# Patient Record
Sex: Female | Born: 1995 | Race: Black or African American | Hispanic: No | Marital: Married | Smoking: Never smoker
Health system: Southern US, Community
[De-identification: ages and names within clinical notes are randomized; demographics above are authoritative.]

## PROBLEM LIST (undated history)

## (undated) DIAGNOSIS — S060X9A Concussion with loss of consciousness of unspecified duration, initial encounter: Secondary | ICD-10-CM

## (undated) DIAGNOSIS — H00019 Hordeolum externum unspecified eye, unspecified eyelid: Secondary | ICD-10-CM

## (undated) DIAGNOSIS — N63 Unspecified lump in unspecified breast: Secondary | ICD-10-CM

## (undated) DIAGNOSIS — S060XAA Concussion with loss of consciousness status unknown, initial encounter: Secondary | ICD-10-CM

## (undated) DIAGNOSIS — T7840XA Allergy, unspecified, initial encounter: Secondary | ICD-10-CM

## (undated) HISTORY — DX: Allergy, unspecified, initial encounter: T78.40XA

## (undated) HISTORY — DX: Concussion with loss of consciousness of unspecified duration, initial encounter: S06.0X9A

## (undated) HISTORY — PX: NO PAST SURGERIES: SHX2092

## (undated) HISTORY — DX: Hordeolum externum unspecified eye, unspecified eyelid: H00.019

## (undated) HISTORY — DX: Concussion with loss of consciousness status unknown, initial encounter: S06.0XAA

---

## 2013-11-13 DIAGNOSIS — M24859 Other specific joint derangements of unspecified hip, not elsewhere classified: Secondary | ICD-10-CM | POA: Insufficient documentation

## 2013-11-20 ENCOUNTER — Encounter (HOSPITAL_COMMUNITY): Payer: Self-pay | Admitting: Emergency Medicine

## 2013-11-20 ENCOUNTER — Emergency Department (INDEPENDENT_AMBULATORY_CARE_PROVIDER_SITE_OTHER): Payer: BC Managed Care – PPO

## 2013-11-20 ENCOUNTER — Emergency Department (HOSPITAL_COMMUNITY)
Admission: EM | Admit: 2013-11-20 | Discharge: 2013-11-20 | Disposition: A | Discharge status: Home / Self Care | Payer: BC Managed Care – PPO | Source: Home / Self Care | Attending: Family Medicine | Admitting: Family Medicine

## 2013-11-20 DIAGNOSIS — R29898 Other symptoms and signs involving the musculoskeletal system: Secondary | ICD-10-CM

## 2013-11-20 DIAGNOSIS — M24859 Other specific joint derangements of unspecified hip, not elsewhere classified: Secondary | ICD-10-CM

## 2013-11-20 MED ORDER — DICLOFENAC SODIUM 50 MG PO TBEC
50.0000 mg | DELAYED_RELEASE_TABLET | Freq: Two times a day (BID) | ORAL | Status: DC | PRN
Start: 2013-11-20 — End: 2017-04-22

## 2013-11-20 NOTE — Discharge Instructions (Signed)
Thank you for coming in today. Go to physical therapy If not getting better followup with Dr. Katrinka BlazingSmith or  sports medicine Do the exercises as above Take diclofenac twice daily for pain as needed  Snapping Hip Syndrome with Rehab Snapping hip syndrome is a condition characterized by snapping that you may feel or hear. There are multiple causes of snapping hip syndrome, some involve soft tissue (tendon or ligament) passing over and being caught on a bony prominence. It may also be caused by loose pieces of bone or cartilage that are present in the hip joint that become caught on soft tissues. SYMPTOMS   "Snapping" sensation in the hip that is either felt and/or heard.  Pain, tenderness, and/or inflammation of the outer hip (iliotibial (IT) band). CAUSES  Snapping hip syndrome is caused by a mechanical disruption in the hip. Common mechanisms of injury include:  Tendons snapping off bony prominences. For example the iliotibial (IT) band snapping over the greater trochanter of the hip.  Loose fragments (bone or cartilage) being caught in the hip joint. RISK INCREASES WITH:  Contact sports (football, hockey, or soccer).  Improperly fitted or padded protective equipment.  Endurance sports (distance running, triathlon, race walking).  Activities that require bending, lifting, or climbing.  Poor strength and flexibility.  Failure to warm-up properly before activity.  Flat feet.  Improper knee alignment (knock knees or bowlegged).  Compensation for other extremity injuries. PREVENTION  Warm up and stretch properly before activity.  Allow for adequate recovery between workouts.  Maintain physical fitness:  Strength, flexibility, and endurance.  Cardiovascular fitness.  Learn and use proper technique. When possible, have a coach correct improper technique.  Wear properly fitted and padded protective equipment.  Wear arch supports (orthotics) if you have flat  feet. PROGNOSIS  If treated properly, then snapping hip syndrome usually resolves within 2 to 6 weeks.  RELATED COMPLICATIONS   Prolonged healing time, if improperly treated or re-injured.  Recurrent symptoms that result in a chronic problem. TREATMENT Treatment initially involves the use of ice and medication to help reduce pain and inflammation. The use of strengthening and stretching exercises may help reduce pain with activity. These exercises may be performed at home or with referral to a therapist. If you have flat feet, then your caregiver may recommend that you wear arch supports. Your caregiver may recommend corticosteroid injections to help reduce inflammation that may be causing the mechanical problems in the hip. If symptoms persist for greater than 6 months despite non-surgical (conservative) treatment, then surgery may be recommended. MEDICATION   If pain medication is necessary, then nonsteroidal anti-inflammatory medications, such as aspirin and ibuprofen, or other minor pain relievers, such as acetaminophen, are often recommended.  Do not take pain medication for 7 days before surgery.  Prescription pain relievers may be given if deemed necessary by your caregiver. Use only as directed and only as much as you need.  Corticosteroid injections may be given by your caregiver. These injections should be reserved for the most serious cases, because they may only be given a certain number of times. HEAT AND COLD  Cold treatment (icing) relieves pain and reduces inflammation. Cold treatment should be applied for 10 to 15 minutes every 2 to 3 hours for inflammation and pain and immediately after any activity that aggravates your symptoms. Use ice packs or massage the area with a piece of ice (ice massage).  Heat treatment may be used prior to performing the stretching and strengthening activities prescribed by  your caregiver, physical therapist, or athletic trainer. Use a heat pack or  soak your injury in warm water. SEEK MEDICAL CARE IF:  Treatment seems to offer no benefit, or the condition worsens.  Any medications produce adverse side effects. EXERCISES RANGE OF MOTION (ROM) AND STRETCHING EXERCISES - Snapping Hip Syndrome These exercises may help you when beginning to rehabilitate your injury. Your symptoms may resolve with or without further involvement from your physician, physical therapist or athletic trainer. While completing these exercises, remember:   Restoring tissue flexibility helps normal motion to return to the joints. This allows healthier, less painful movement and activity.  An effective stretch should be held for at least 30 seconds.  A stretch should never be painful. You should only feel a gentle lengthening or release in the stretched tissue. STRETCH - Hip Rotators  Lie on your back on a firm surface. Grasp your right / left knee with your right / left hand and your ankle with your opposite hand.  Keeping your hips and shoulders firmly planted, gently pull your right / left knee and rotate your lower leg toward your opposite shoulder until you feel a stretch in your buttocks.  Hold this stretch for __________ seconds. Repeat this stretch __________ times. Complete this stretch __________ times per day. STRETCH  Iliotibial Band   On the floor or bed, lie on your side so your right / left leg is on top. Bend your knee and grab your ankle.  Slowly bring your knee back so that your thigh is in line with your trunk. Keep your heel at your buttocks and gently arch your back so your head, shoulders and hips line up.  Slowly lower your leg so that your knee approaches the floor/bed until you feel a gentle stretch on the outside of your right / left thigh. If you do not feel a stretch and your knee will not fall farther, place the heel of your opposite foot on top of your knee and pull your thigh down farther.  Hold this stretch for __________  seconds. Repeat __________ times. Complete this exercise __________ times per day. STRETCH - Hip Adductors, Sitting  Sit on the floor and place the bottoms of your feet together. Keep your chest up and look straight ahead to keep your back in proper alignment. Slide your feet in towards your body as far as you can without rounding your back or increasing any discomfort.  Gently push down on your knees until you feel a gentle stretch in your inner thighs. Hold this position for __________ seconds. Repeat __________ times. Complete this exercise __________ times per day.  STRETCHING - Hip Flexors, Lunge  Half kneel with your right / left knee on the floor and your opposite knee bent and directly over your ankle.  Keep good posture with your head over your shoulders. Tighten your buttocks to point your tailbone downward; this will prevent your back from arching too much.  You should feel a gentle stretch in the front of your right / left thigh and/or hip. If you do not feel any resistance, slightly slide your opposite foot forward and then slowly lunge forward so your knee once again lines up over your ankle. Be sure your tailbone remains pointed downward.  Hold this stretch for __________ seconds. Repeat __________ times. Complete this stretch __________ times per day. STRENGTHENING EXERCISES - Snapping Hip Syndrome These exercises may help you when beginning to rehabilitate your injury. They may resolve your symptoms with or  without further involvement from your physician, physical therapist or athletic trainer. While completing these exercises, remember:   Muscles can gain both the endurance and the strength needed for everyday activities through controlled exercises.  Complete these exercises as instructed by your physician, physical therapist or athletic trainer. Progress with the resistance and repetition exercises only as your caregiver advises.  You may experience muscle soreness or  fatigue, but the pain or discomfort you are trying to eliminate should never worsen during these exercises. If this pain does worsen, stop and make certain you are following the directions exactly. If the pain is still present after adjustments, discontinue the exercise until you can discuss the trouble with your clinician. STRENGTH - Hip Abductors, Straight Leg Raises Be aware of your form throughout the entire exercise so that you exercise the correct muscles. Sloppy form means that you are not strengthening the correct muscles.  Lie on your side so that your head, shoulders, knee and hip line up. You may bend your lower knee to help maintain your balance. Your right / left leg should be on top.  Roll your hips slightly forward, so that your hips are stacked directly over each other and your right / left knee is facing forward.  Lift your top leg up 4-6 inches, leading with your heel. Be sure that your foot does not drift forward or that your knee does not roll toward the ceiling.  Hold this position for __________ seconds. You should feel the muscles in your outer hip lifting (you may not notice this until your leg begins to tire).  Slowly lower your leg to the starting position. Allow the muscles to fully relax before beginning the next repetition. Repeat __________ times. Complete this exercise __________ times per day.  STRENGTH - Hip Abductors, Quadriped  On a firm, lightly padded surface, position yourself on your hands and knees. Your hands should be directly below your shoulders and your knees should be directly below your hips.  Keeping your right / left knee bent, lift your leg out to the side. Keep your legs level and in line with your shoulders.  Position yourself on your hands and knees.  Hold for __________ seconds.  Keeping your trunk steady and your hips level, slowly lower your leg to the starting position. Repeat __________ times. Complete this exercise __________ times  per day.  STRENGTH - Hip Abductors, Standing Be aware of your form throughout the entire exercise so that you exercise the correct muscles. Sloppy form means that you are not strengthening the correct muscles.  Tie one end of a rubber exercise band/tubing to a secure surface (table, pole) and tie a loop at the other end.  Place the loop around your right / left ankle. Turn your body sideways so that your opposite side faces the table or pole and your right / left leg away from the table or pole. Step away from the pole or table until there is tension in the tube/band.  Hold onto a chair as needed for balance.  Keeping your back upright, your shoulders over your hips, and your toes pointing forward, lift your right / left leg out to your side. Be sure to lift your leg with your hip muscles. Do not "throw" your leg or tip your body to lift your leg.  Slowly and with control, return to the starting position. Repeat exercise __________ times. Complete this exercise __________ times per day.  Document Released: 08/19/2005 Document Revised: 11/11/2011 Document Reviewed: 12/01/2008  [employer]® Patient Information ©2014 [employer]. ° °

## 2013-11-20 NOTE — ED Notes (Signed)
Pt  Reports  l  Hip  Pain  X  3  Weeks  Pain on  Certain  Movements  And  posistions          Pt     denys  Any  specefic injury          Ambulated  To room   With a  Steady  Fluid  Gait

## 2013-11-20 NOTE — ED Provider Notes (Signed)
Selena Barton is a 18 y.o. female who presents to Urgent Care today for left hip pain. Patient has left groin pain radiating to the anterior knee with hip motion and walking. This is been present for 3 weeks. This is associated with a snapping sensation. She denies any pain radiating beyond the knee weakness or numbness. No swelling bladder dysfunction. No difficulty walking. She's tried some over-the-counter medications which have not been very effective. She feels well otherwise.   History reviewed. No pertinent past medical history. History  Substance Use Topics  . Smoking status: Never Smoker   . Smokeless tobacco: Not on file  . Alcohol Use: No   ROS as above Medications: No current facility-administered medications for this encounter.   Current Outpatient Prescriptions  Medication Sig Dispense Refill  . diclofenac (VOLTAREN) 50 MG EC tablet Take 1 tablet (50 mg total) by mouth 2 (two) times daily as needed.  60 tablet  0    Exam:  BP 124/65  Pulse 97  Temp(Src) 98.2 F (36.8 C) (Oral)  Resp 16  SpO2 100%  LMP 11/07/2013 Gen: Well NAD Hips:  Right hip: Normal-appearing nontender full range of motion Left hip: Normal-appearing nontender. Pain with internal rotation and Faber test. Pain with FADIR tests as well. Snapping and pain with Pearlean BrownieFaber test motion. Refill and sensation are intact distally  Limited musculoskeletal ultrasound of the left hip: The semicircular joint was well visualized. The superficial anterior labrum was seen and normal appearing. No hip effusion.   No results found for this or any previous visit (from the past 24 hour(s)). Dg Pelvis 1-2 Views  11/20/2013   CLINICAL DATA:  18 year old female with left hip pain. No known injury  EXAM: PELVIS - 1-2 VIEW  COMPARISON:  None.  FINDINGS: There is no evidence of pelvic fracture or diastasis. No other pelvic bone lesions are seen.  IMPRESSION: Negative.   Electronically Signed   By: Laveda AbbeJeff  Hu M.D.   On:  11/20/2013 15:12    Assessment and Plan: 18 y.o. 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 Plan to treat with NSAIDs and physical therapy. Followup at sports medicine if not better.  Discussed warning signs or symptoms. Please see discharge instructions. Patient expresses understanding.    Rodolph BongEvan S Schyler Counsell, MD 11/20/13 [id number]

## 2015-08-30 ENCOUNTER — Other Ambulatory Visit: Payer: Self-pay | Admitting: Family Medicine

## 2015-08-30 DIAGNOSIS — N6320 Unspecified lump in the left breast, unspecified quadrant: Secondary | ICD-10-CM

## 2015-09-18 IMAGING — CR DG PELVIS 1-2V
1 series · 1 of 1 positions shown · non-contrast
Comparison: None.

CLINICAL DATA: 17-year-old female with left hip pain. No known
injury

EXAM:
PELVIS - 1-2 VIEW

[view not recorded]
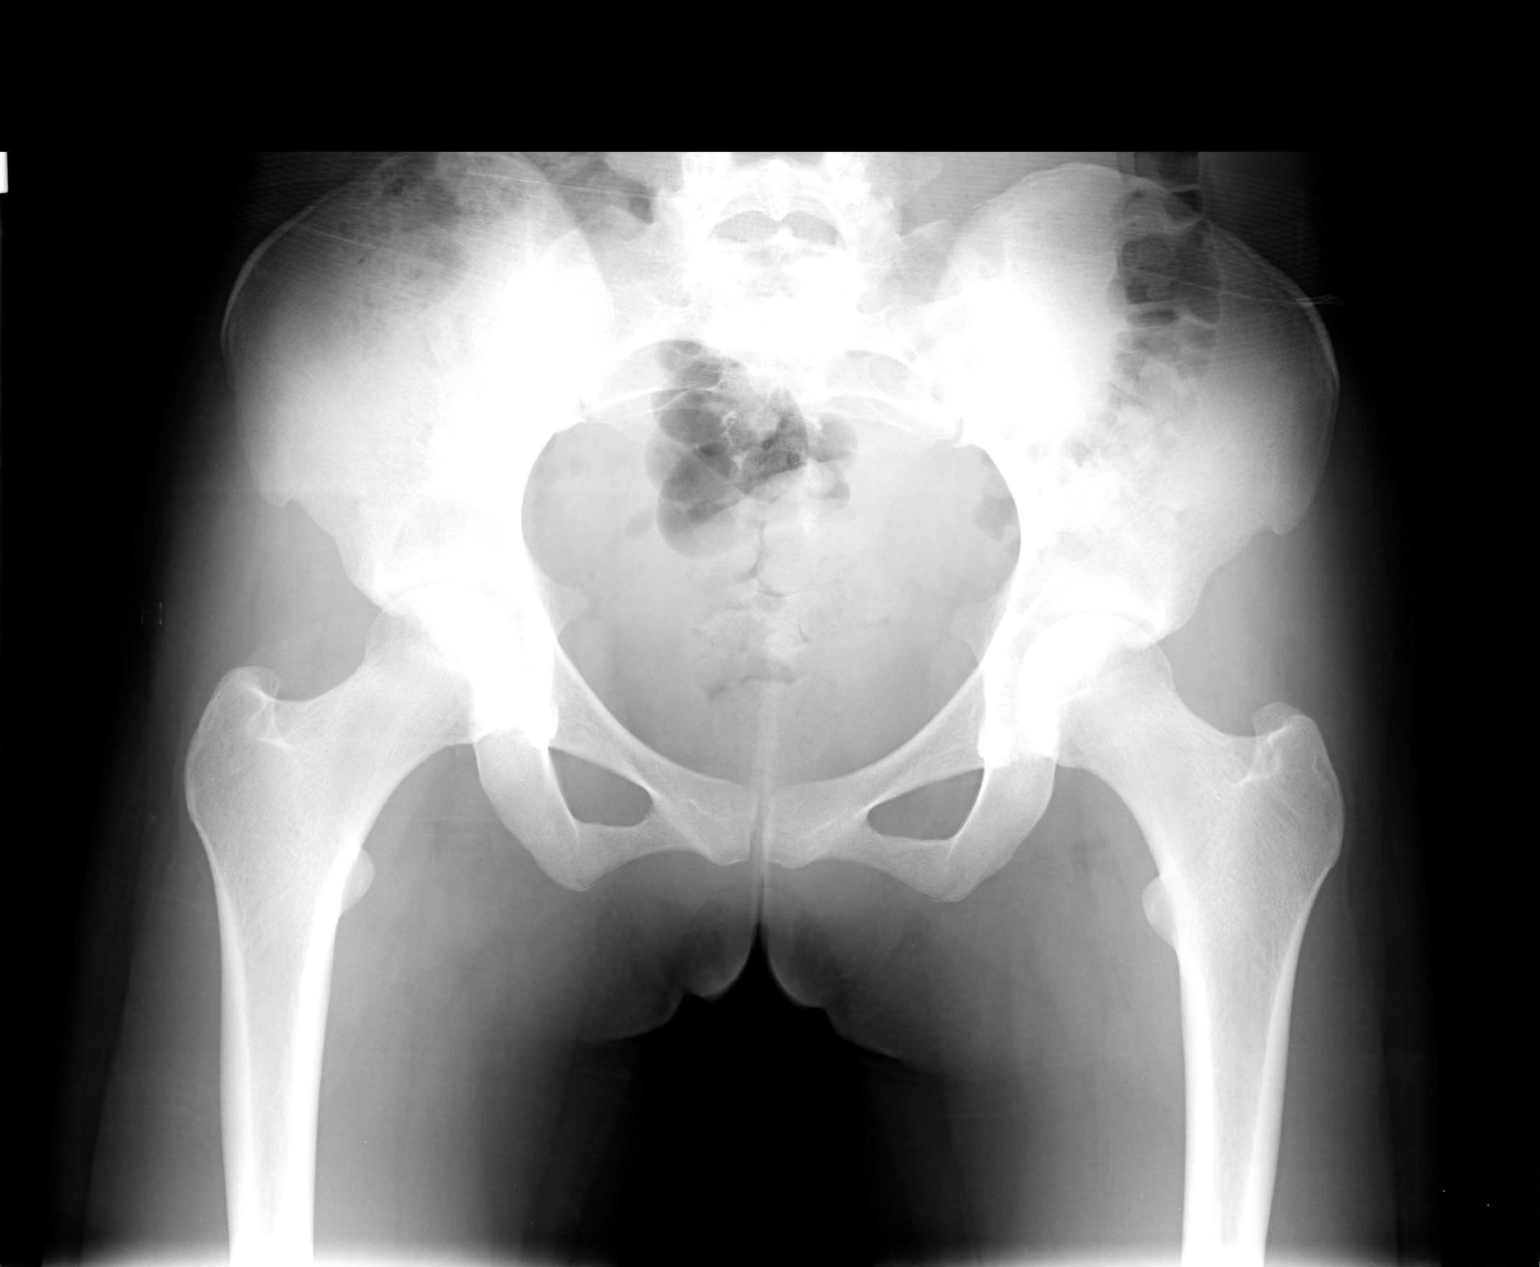

[1 of 1 positions shown; findings below may reference images not displayed]

FINDINGS: There is no evidence of pelvic fracture or diastasis. No other
pelvic bone lesions are seen.
IMPRESSION: Negative.

## 2017-04-22 ENCOUNTER — Encounter: Payer: Self-pay | Admitting: Family Medicine

## 2017-04-22 ENCOUNTER — Ambulatory Visit (INDEPENDENT_AMBULATORY_CARE_PROVIDER_SITE_OTHER): Payer: 59 | Admitting: Family Medicine

## 2017-04-22 VITALS — BP 120/64 | HR 68 | Ht 60.0 in | Wt 97.0 lb

## 2017-04-22 DIAGNOSIS — N6320 Unspecified lump in the left breast, unspecified quadrant: Secondary | ICD-10-CM | POA: Diagnosis not present

## 2017-04-22 NOTE — Progress Notes (Signed)
Name: Selena Barton   MRN: [id number]    DOB: 05-26-1996   Date:04/22/2017       Progress Note  Subjective  Chief Complaint  Chief Complaint  Patient presents with  . Establish Care    [place] PCP/ moved closer to here  . Breast Mass    "use to only be there when periods were on, but now it stays there" L) breast- feels hard, gets tender during period.- Has noticed it there with periods x 1 year- has stayed there for 6 months    Patient noted "knot" upper outer quadrant left breast. Initially associated with periods. Now remains persistent.  Tender/painful during periods.      No problem-specific Assessment & Plan notes found for this encounter.   Past Medical History:  Diagnosis Date  . Allergy   . Concussion    x 2    History reviewed. No pertinent surgical history.  Family History  Problem Relation Age of Onset  . Hypertension Mother   . Diabetes Paternal Grandmother     Social History   Social History  . Marital status: Single    Spouse name: N/A  . Number of children: N/A  . Years of education: N/A   Occupational History  . Not on file.   Social History Main Topics  . Smoking status: Never Smoker  . Smokeless tobacco: Never Used  . Alcohol use No  . Drug use: No  . Sexual activity: Yes   Other Topics Concern  . Not on file   Social History Narrative  . No narrative on file    No Known Allergies  Outpatient Medications Prior to Visit  Medication Sig Dispense Refill  . diclofenac (VOLTAREN) 50 MG EC tablet Take 1 tablet (50 mg total) by mouth 2 (two) times daily as needed. 60 tablet 0   No facility-administered medications prior to visit.     Review of Systems  Constitutional: Negative for chills, diaphoresis, fever, malaise/fatigue and weight loss.  HENT: Negative for ear discharge, ear pain, nosebleeds and sore throat.   Eyes: Negative for blurred vision.  Respiratory: Negative for cough, sputum production, shortness of breath and  wheezing.   Cardiovascular: Negative for chest pain, palpitations and leg swelling.  Gastrointestinal: Negative for abdominal pain, blood in stool, constipation, diarrhea, heartburn, melena and nausea.  Genitourinary: Negative for dysuria, frequency, hematuria and urgency.       Family hx fibrocystic disease/ aunt breast cancer  Musculoskeletal: Positive for myalgias. Negative for back pain, joint pain and neck pain.       Iliotibial /snapping hip  Skin: Negative for rash.  Neurological: Positive for headaches. Negative for dizziness, tingling, sensory change, focal weakness and weakness.  Endo/Heme/Allergies: Negative for environmental allergies and polydipsia. Does not bruise/bleed easily.  Psychiatric/Behavioral: Negative for depression and suicidal ideas. The patient is not nervous/anxious and does not have insomnia.      Objective  Vitals:   04/22/17 1440  BP: 120/64  Pulse: 68  Weight: 97 lb (44 kg)  Height: 5' (1.524 m)    Physical Exam  Constitutional: She is well-developed, well-nourished, and in no distress. No distress.  HENT:  Head: Normocephalic and atraumatic.  Right Ear: External ear normal.  Left Ear: External ear normal.  Nose: Nose normal.  Mouth/Throat: Oropharynx is clear and moist.  Eyes: Pupils are equal, round, and reactive to light. Conjunctivae and EOM are normal. Right eye exhibits no discharge. Left eye exhibits no discharge.  Neck: Normal range of  motion. Neck supple. No JVD present. No thyromegaly present.  Cardiovascular: Normal rate, regular rhythm, normal heart sounds and intact distal pulses.  Exam reveals no gallop and no friction rub.   No murmur heard. Pulmonary/Chest: Effort normal and breath sounds normal. She has no wheezes. She has no rales. Right breast exhibits no inverted nipple, no mass, no nipple discharge, no skin change and no tenderness. Left breast exhibits mass. Left breast exhibits no inverted nipple, no nipple discharge, no skin  change and no tenderness. Breasts are symmetrical.    Plaque like   Abdominal: Soft. Bowel sounds are normal. She exhibits no mass. There is no tenderness. There is no guarding.  Musculoskeletal: Normal range of motion.  Lymphadenopathy:    She has no cervical adenopathy.  Neurological: She is alert.  Skin: Skin is warm and dry. She is not diaphoretic.  Psychiatric: Mood and affect normal.  Nursing note and vitals reviewed.     Assessment & Plan  Problem List Items Addressed This Visit    None    Visit Diagnoses    Breast mass, left    -  Primary   Relevant Orders   Ambulatory referral to General Surgery      No orders of the defined types were placed in this encounter.  sched appt with Dr Aleen Campi 04/23/17@3 :00 in [place]   Dr. Hay[facility]  04/22/17

## 2017-04-23 ENCOUNTER — Ambulatory Visit: Payer: 59 | Admitting: Surgery

## 2017-04-24 ENCOUNTER — Encounter: Payer: Self-pay | Admitting: Surgery

## 2017-04-24 ENCOUNTER — Telehealth: Payer: Self-pay

## 2017-04-24 ENCOUNTER — Ambulatory Visit (INDEPENDENT_AMBULATORY_CARE_PROVIDER_SITE_OTHER): Payer: 59 | Admitting: Surgery

## 2017-04-24 VITALS — BP 121/66 | HR 72 | Temp 97.9°F | Ht 60.0 in | Wt 98.4 lb

## 2017-04-24 DIAGNOSIS — N6320 Unspecified lump in the left breast, unspecified quadrant: Secondary | ICD-10-CM | POA: Diagnosis not present

## 2017-04-24 NOTE — Telephone Encounter (Signed)
Contacted patient and had to leave her a voicemail. I left patient a detailed message with her appointment for her Breast Ultrasound at [facility] at [address], [place], [place] [address] [phone] on 04/28/2017 at 2:00 PM. Patient is to arrive at 1:45 PM. Patient does not need to be fasting.

## 2017-04-24 NOTE — Patient Instructions (Signed)
I will call you as soon as I schedule your Ultrasound. Then you will follow up with Dr. Earlene Plater to discuss results and what the plan will be.

## 2017-04-24 NOTE — Progress Notes (Signed)
Surgical Clinic History and Physical  Referring provider:  Duanne Limerick, MD [address] [place], [place] [address]  HISTORY OF PRESENT ILLNESS (HPI):  21 y.o. female presents for evaluation of a Left breast mass. Patient reports she first noticed the Left breast mass ~2 years ago, at which time it was small and would enlarge with her menstrual period and then disappear. Review of her chart indicates a Left breast ultrasound was ordered for 08/30/2015, but was never performed. Patient says the mass appeared to resolve at the time the ultrasound was ordered and was not performed accordingly. Since that time, the mass has persisted and enlarged, no longer changing in relation to her menstrual cycles. Patient otherwise denies any Left breast pain, nipple discharge, fever/chills, or weight loss.  PAST MEDICAL HISTORY (PMH):  Past Medical History:  Diagnosis Date  . Allergy   . Concussion    x 2     PAST SURGICAL HISTORY (PSH):  Past Surgical History:  Procedure Laterality Date  . NO PAST SURGERIES       MEDICATIONS:  Prior to Admission medications   Not on File     ALLERGIES:  No Known Allergies   SOCIAL HISTORY:  Social History   Social History  . Marital status: Single    Spouse name: N/A  . Number of children: N/A  . Years of education: N/A   Occupational History  . Not on file.   Social History Main Topics  . Smoking status: Never Smoker  . Smokeless tobacco: Never Used  . Alcohol use No  . Drug use: No  . Sexual activity: Yes   Other Topics Concern  . Not on file   Social History Narrative  . No narrative on file    The patient currently resides (home / rehab facility / nursing home): Home  The patient normally is (ambulatory / bedbound): Ambulatory   FAMILY HISTORY:  Family History  Problem Relation Age of Onset  . Hypertension Mother   . Diabetes Paternal Grandmother   . Breast cancer Maternal Aunt     Otherwise  negative/non-contributory.  REVIEW OF SYSTEMS:  Constitutional: denies any other weight loss, fever, chills, or sweats  Eyes: denies any other vision changes, history of eye injury  ENT: denies sore throat, hearing problems Breast: Left - non-tender 3 cm (horizontal) x 2 cm (vertical) firm mobile mass at 1 o'clock position with no palpable lymphade Respiratory: denies shortness of breath, wheezing  Cardiovascular: denies chest pain, palpitations  Gastrointestinal: denies abdominal pain, N/V, or diarrhea Musculoskeletal: denies any other joint pains or cramps  Skin: Denies any other rashes or skin discolorations  Neurological: denies any other headache, dizziness, weakness  Psychiatric: Denies any other depression, anxiety   All other review of systems were otherwise negative   VITAL SIGNS:  BP 121/66   Pulse 72   Temp 97.9 F (36.6 C) (Oral)   Ht 5' (1.524 m)   Wt 98 lb 6.4 oz (44.6 kg)   LMP 04/17/2017 (Exact Date)   BMI 19.22 kg/m   PHYSICAL EXAM:  Constitutional:  -- Normal body habitus  -- Awake, alert, and oriented x3  Eyes:  -- Pupils equally round and reactive to light  -- No scleral icterus  Ear, nose, throat:  -- No jugular venous distension -- No nasal drainage, bleeding Breast: -- LEFT: Non-tender 3 cm (horizontal) x 2 cm (vertical) firm, mobile Left breast mass at 1 o'clock position, no appreciable nipple discharge/drainage, no palpable  lymphadenopathy -- Right: No palpable masses or lymphadenopathy Pulmonary:  -- No crackles  -- Equal breath sounds bilaterally -- Breathing non-labored at rest Cardiovascular:  -- S1, S2 present  -- No pericardial rubs  Gastrointestinal:  -- Abdomen soft, nontender, nondistended, no guarding/rebound  -- No abdominal masses appreciated, pulsatile or otherwise  Musculoskeletal and Integumentary:  -- Wounds or skin discoloration: None appreciated -- Extremities: B/L UE and LE FROM, hands and feet warm, no edema   Neurologic:  -- Motor function: Intact and symmetric -- Sensation: Intact and symmetric  Labs: CBC: No results found for: WBC, RBC BMP: No results found for: GLUCOSE, POTASSIUM, CHLORIDE, CO2, BUN, CREATININE, CALCIUM   Assessment/Plan:  21 y.o. female with enlarging persistent 3 cm x 2 cm firm mobile Left breast mass in the 1 o'clock position x 2 years without palpable lymphadenopathy and no prior imaging, complicated by maternal aunt history of breast cancer and co-morbidities including only prior concussion.   - refer for core needle biopsy and breast imaging  - follow-up Left breast core needle biopsy pathology  - return to clinic as soon as pathology available  All of the above recommendations were discussed with the patient, and all of patient's questions were answered to her expressed satisfaction.  Thank you for the opportunity to participate in this patient's care.  -- Scherrie Gerlach Earlene Plater, MD, RPVI [place]: [facility] General [facility] - Partnering for exceptional care. Office: [phone]

## 2017-04-25 NOTE — Telephone Encounter (Signed)
Called patient back to make sure that she received my message yesterday. She stated that she did and that she would go and have her U/S done. I told her that I would call her once I had her results. Patient understood.

## 2017-04-28 ENCOUNTER — Ambulatory Visit
Admission: RE | Admit: 2017-04-28 | Discharge: 2017-04-28 | Disposition: A | Discharge status: Home / Self Care | Payer: 59 | Source: Ambulatory Visit | Attending: Surgery | Admitting: Surgery

## 2017-04-28 DIAGNOSIS — N6320 Unspecified lump in the left breast, unspecified quadrant: Secondary | ICD-10-CM | POA: Insufficient documentation

## 2017-04-28 HISTORY — DX: Unspecified lump in unspecified breast: N63.0

## 2017-04-29 ENCOUNTER — Telehealth: Payer: Self-pay

## 2017-04-29 NOTE — Telephone Encounter (Signed)
Called patient to give her Ultrasound results. I also scheduled a follow up appointment with Dr. Connye Burkitt to go over results. I will also schedule her U/S in 6 months when she comes back. Patient understood.

## 2017-05-06 ENCOUNTER — Encounter: Payer: Self-pay | Admitting: Surgery

## 2017-05-06 ENCOUNTER — Ambulatory Visit (INDEPENDENT_AMBULATORY_CARE_PROVIDER_SITE_OTHER): Payer: 59 | Admitting: Surgery

## 2017-05-06 VITALS — BP 119/74 | HR 72 | Temp 98.9°F | Ht 60.0 in | Wt 98.0 lb

## 2017-05-06 DIAGNOSIS — N6320 Unspecified lump in the left breast, unspecified quadrant: Secondary | ICD-10-CM | POA: Diagnosis not present

## 2017-05-06 DIAGNOSIS — S060XAA Concussion with loss of consciousness status unknown, initial encounter: Secondary | ICD-10-CM | POA: Insufficient documentation

## 2017-05-06 DIAGNOSIS — T7840XA Allergy, unspecified, initial encounter: Secondary | ICD-10-CM | POA: Insufficient documentation

## 2017-05-06 DIAGNOSIS — S060X9A Concussion with loss of consciousness of unspecified duration, initial encounter: Secondary | ICD-10-CM | POA: Insufficient documentation

## 2017-05-06 NOTE — Progress Notes (Signed)
Surgical Clinic Progress/Follow-up Note   HPI:  21 y.o. Female presents to clinic for follow-up evaluation of her Left breast mass s/p Left breast ultrasound. Patient reports the mass appears to be unchanged in size and continues to be more tender in association with her upcoming menstrual cycle. She acknowledges having completed Left breast ultrasound and discussed the results with both the supervising radiologist and our office's RN prior to her appointment today. She otherwise continues to deny any nipple discharge, fever/chills, or weight loss and asks about what might be involved with having the mass removed even if imaging is not concerning for cancer.  Review of Systems:  Constitutional: denies any other weight loss, fever, chills, or sweats  Eyes: denies any other vision changes, history of eye injury  ENT: denies sore throat, hearing problems  Respiratory: denies shortness of breath, wheezing  Cardiovascular: denies chest pain, palpitations  Gastrointestinal: denies abdominal pain, N/V, or diarrhea Musculoskeletal: denies any other joint pains or cramps  Skin: Denies any other rashes or skin discolorations  Neurological: denies any other headache, dizziness, weakness  Psychiatric: denies any other depression, anxiety  All other review of systems: otherwise negative   Vital Signs:  BP 119/74   Pulse 72   Temp 98.9 F (37.2 C) (Oral)   Ht 5' (1.524 m)   Wt 98 lb (44.5 kg)   LMP 04/17/2017 (Exact Date)   BMI 19.14 kg/m    Physical Exam:  Constitutional:  -- Normal body habitus  -- Awake, alert, and oriented x3  Eyes:  -- Pupils equally round and reactive to light  -- No scleral icterus  Ear, nose, throat:  -- No jugular venous distension  -- No nasal drainage, bleeding Pulmonary:  -- No crackles -- Equal breath sounds bilaterally -- Breathing non-labored at rest Cardiovascular:  -- S1, S2 present  -- No pericardial rubs Breasts: Exam not repeated during this  visit Gastrointestinal:  -- Soft, nontender, nondistended, no guarding/rebound  -- No abdominal masses appreciated, pulsatile or otherwise  Musculoskeletal / Integumentary:  -- Wounds or skin discoloration: None appreciated  -- Extremities: B/L UE and LE FROM, hands and feet warm, no edema  Neurologic:  -- Motor function: intact and symmetric  -- Sensation: intact and symmetric   Imaging:  Left Breast Ultrasound (04/28/2017) Targeted ultrasound is performed, showing a hypoechoic oval circumscribed mass in the 2:30 o'clock position of the left breast, 2 cm the nipple, measuring 3.8 x 1.8 x 3.3 cm in size.  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  Short-term follow-up in 6 months is recommended.  Assessment:  21 y.o. yo Female with a problem list including...  Patient Active Problem List   Diagnosis Date Noted  . Allergy   . Concussion   . Left breast mass 04/24/2017    presents to clinic for discussion about results of recent Left breast ultrasound and follow-up accordingly.  Plan:   - no indication for urgent surgical intervention  - check follow-up repeat Left breast ultrasound in 6 months  - results of Left breast ultrasound discussed with the patient and all of her questions were answered  - patient expresses she may want to have mass excised for both symptomatic relief and to alleviate her concerns despite imaging results  - return to clinic following Left breast ultrasound in 6 months, instructed to call office if any questions or concerns  All of the  above recommendations were discussed with the patient, and all of patient's questions were answered to her expressed satisfaction.  -- Scherrie GerlachJason E. Earlene Plateravis, MD, RPVI [place]: [facility] - Partnering for exceptional care. Office: [phone]

## 2017-05-06 NOTE — Patient Instructions (Addendum)
Please give us a call in case you have any questions or concerns.   If you notice that the cyst keeps getting larger, changes on your skin, warm at touch, discharge from your nipple, please give us a call.

## 2017-06-23 ENCOUNTER — Encounter: Payer: Self-pay | Admitting: Family Medicine

## 2017-06-23 ENCOUNTER — Ambulatory Visit (INDEPENDENT_AMBULATORY_CARE_PROVIDER_SITE_OTHER): Payer: 59 | Admitting: Family Medicine

## 2017-06-23 VITALS — BP 110/80 | HR 80 | Temp 98.5°F | Ht 60.0 in | Wt 98.0 lb

## 2017-06-23 DIAGNOSIS — F0781 Postconcussional syndrome: Secondary | ICD-10-CM

## 2017-06-23 DIAGNOSIS — K2900 Acute gastritis without bleeding: Secondary | ICD-10-CM | POA: Diagnosis not present

## 2017-06-23 DIAGNOSIS — M542 Cervicalgia: Secondary | ICD-10-CM | POA: Diagnosis not present

## 2017-06-23 MED ORDER — PANTOPRAZOLE SODIUM 40 MG PO TBEC: 40.0000 mg | DELAYED_RELEASE_TABLET | Freq: Every day | ORAL | 3 refills | Status: DC

## 2017-06-23 NOTE — Progress Notes (Signed)
Name: Selena Barton   MRN: [id number]    DOB: [birth year]   Date:06/23/2017       Progress Note  Subjective  Chief Complaint  Chief Complaint  Patient presents with  . Abdominal Pain    started yesterday after drinking coffee. Has gotten worse- burning sensation. regular BM- nothing otc for stomach pain- was told approx 2 years ago that she had an ulcer through a blood test    Abdominal Pain  This is a new problem. The current episode started yesterday. The onset quality is sudden. The problem occurs constantly. The problem has been waxing and waning. The pain is located in the LUQ. The pain is at a severity of 4/10. The pain is moderate. The quality of the pain is burning. The abdominal pain does not radiate. Associated symptoms include nausea and weight loss. Pertinent negatives include no anorexia, arthralgias, belching, constipation, diarrhea, dysuria, fever, flatus, frequency, headaches, hematochezia, hematuria, melena, myalgias or vomiting. Exacerbated by: caffeine. Relieved by: food. She has tried proton pump inhibitors (previous episode) for the symptoms. The treatment provided moderate relief. There is no history of abdominal surgery.  Neck Pain   This is a new problem. The current episode started 1 to 4 weeks ago. The problem has been waxing and waning. The pain is associated with nothing. The quality of the pain is described as aching. The pain is mild. Associated symptoms include weight loss. Pertinent negatives include no chest pain, fever, headaches, tingling, visual change or weakness.  Neurologic Problem  The patient's primary symptoms include memory loss. The patient's pertinent negatives include no altered mental status, focal sensory loss, focal weakness, near-syncope, slurred speech, syncope, visual change or weakness. Primary symptoms comment: two concussions in past. This is a recurrent problem. The problem has been waxing and waning since onset. There was no focality noted.  Associated symptoms include abdominal pain, confusion and nausea. Pertinent negatives include no back pain, chest pain, dizziness, fever, headaches, neck pain, palpitations, shortness of breath or vomiting.    No problem-specific Assessment & Plan notes found for this encounter.   Past Medical History:  Diagnosis Date  . Allergy   . Breast mass   . Concussion    x 2    Past Surgical History:  Procedure Laterality Date  . NO PAST SURGERIES      Family History  Problem Relation Age of Onset  . Hypertension Mother   . Diabetes Paternal Grandmother   . Breast cancer Maternal Aunt     Social History   Social History  . Marital status: Single    Spouse name: N/A  . Number of children: N/A  . Years of education: N/A   Occupational History  . Not on file.   Social History Main Topics  . Smoking status: Never Smoker  . Smokeless tobacco: Never Used  . Alcohol use No  . Drug use: Yes    Types: Marijuana     Comment: Last Use Yesterday- per patient 05/06/17  . Sexual activity: Yes   Other Topics Concern  . Not on file   Social History Narrative  . No narrative on file    No Known Allergies  No outpatient prescriptions prior to visit.   No facility-administered medications prior to visit.     Review of Systems  Constitutional: Positive for weight loss. Negative for chills, fever and malaise/fatigue.  HENT: Negative for ear discharge, ear pain and sore throat.   Eyes: Negative for blurred vision.  Respiratory: Negative  for cough, sputum production, shortness of breath and wheezing.   Cardiovascular: Negative for chest pain, palpitations, leg swelling and near-syncope.  Gastrointestinal: Positive for abdominal pain and nausea. Negative for anorexia, blood in stool, constipation, diarrhea, flatus, heartburn, hematochezia, melena and vomiting.  Genitourinary: Negative for dysuria, frequency, hematuria and urgency.  Musculoskeletal: Negative for arthralgias, back  pain, joint pain, myalgias and neck pain.  Skin: Negative for rash.  Neurological: Negative for dizziness, tingling, sensory change, focal weakness, syncope, weakness and headaches.  Endo/Heme/Allergies: Negative for environmental allergies and polydipsia. Does not bruise/bleed easily.  Psychiatric/Behavioral: Positive for confusion and memory loss. Negative for depression and suicidal ideas. The patient is not nervous/anxious and does not have insomnia.      Objective  Vitals:   06/23/17 1347  BP: 110/80  Pulse: 80  Temp: 98.5 F (36.9 C)  TempSrc: Oral  Weight: 98 lb (44.5 kg)  Height: 5' (1.524 m)    Physical Exam  Constitutional: She is well-developed, well-nourished, and in no distress. No distress.  HENT:  Head: Normocephalic and atraumatic.  Right Ear: External ear normal.  Left Ear: External ear normal.  Nose: Nose normal.  Mouth/Throat: Oropharynx is clear and moist.  Eyes: Pupils are equal, round, and reactive to light. Conjunctivae and EOM are normal. Right eye exhibits no discharge. Left eye exhibits no discharge.  Neck: Normal range of motion. Neck supple. No JVD present. No thyromegaly present.  Cardiovascular: Normal rate, regular rhythm, normal heart sounds and intact distal pulses.  Exam reveals no gallop and no friction rub.   No murmur heard. Pulmonary/Chest: Effort normal and breath sounds normal. She has no wheezes. She has no rales.  Abdominal: Soft. Bowel sounds are normal. She exhibits no mass. There is no hepatosplenomegaly. There is tenderness in the left upper quadrant. There is no rebound and no guarding.  Musculoskeletal: Normal range of motion. She exhibits no edema.  Lymphadenopathy:    She has no cervical adenopathy.  Neurological: She is alert. She has normal sensation, normal strength and intact cranial nerves.  Skin: Skin is warm and dry. She is not diaphoretic.  Psychiatric: Mood and affect normal.  Nursing note and vitals  reviewed.     Assessment & Plan  Problem List Items Addressed This Visit    None    Visit Diagnoses    Acute gastritis without hemorrhage, unspecified gastritis type    -  Primary   Relevant Medications   pantoprazole (PROTONIX) 40 MG tablet   Other Relevant Orders   H. pylori antibody, IgG   Cervical pain       Postconcussion syndrome       Relevant Orders   Ambulatory referral to Neurology      Meds ordered this encounter  Medications  . pantoprazole (PROTONIX) 40 MG tablet    Sig: Take 1 tablet (40 mg total) by mouth daily.    Dispense:  30 tablet    Refill:  3      Dr. Hayden Rasmusseneanna Ardis [facility]  06/23/17

## 2017-06-24 LAB — H. PYLORI ANTIBODY, IGG: H. pylori, IgG AbS: <0.8 Index Value (ref 0.00–0.79)

## 2017-07-10 DIAGNOSIS — R51 Headache: Secondary | ICD-10-CM

## 2017-07-10 DIAGNOSIS — F0781 Postconcussional syndrome: Secondary | ICD-10-CM | POA: Insufficient documentation

## 2017-07-10 DIAGNOSIS — R519 Headache, unspecified: Secondary | ICD-10-CM | POA: Insufficient documentation

## 2017-08-29 IMAGING — US US BREAST*L* LIMITED INC AXILLA
1 series · 11 of 11 positions shown · non-contrast
Comparison: None

CLINICAL DATA: Patient presents with a palpable lump in the lateral
left breast, which she has noted for approximately 2 years, and
which waxes and wanes with her menstrual cycle.

EXAM:
ULTRASOUND OF THE LEFT BREAST

[Series 1: us breast*left* limited inc axilla · 0.06mm/px · 11 of 11 slices shown]
[im 1/11]
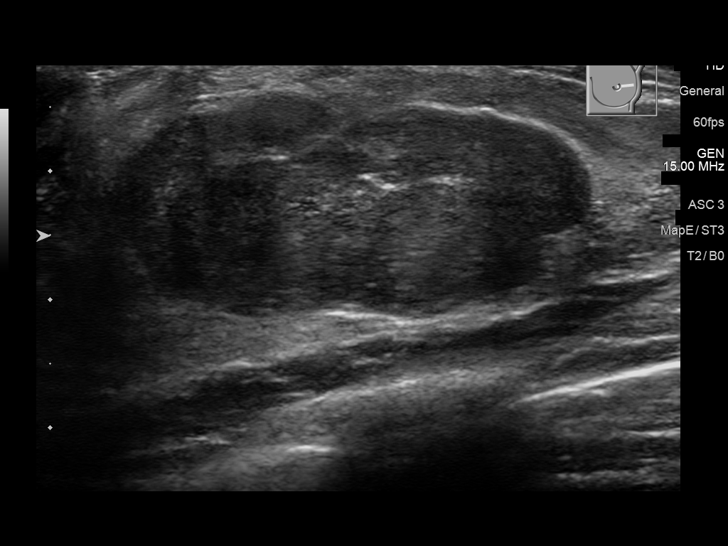
[im 2/11]
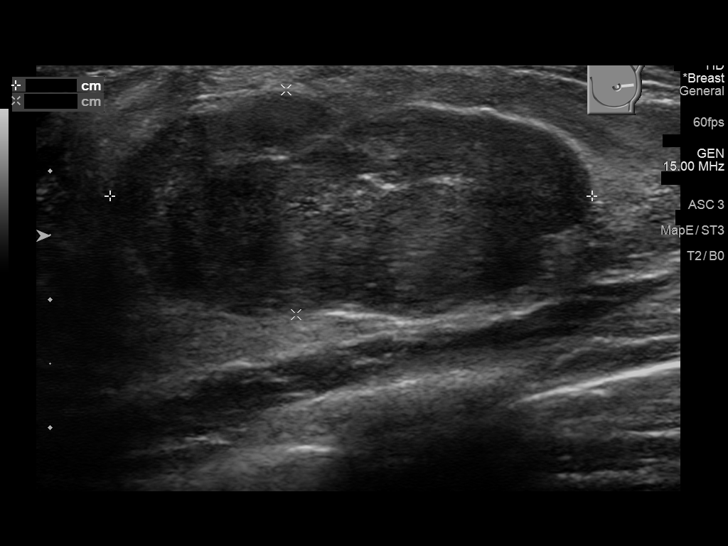
[im 3/11]
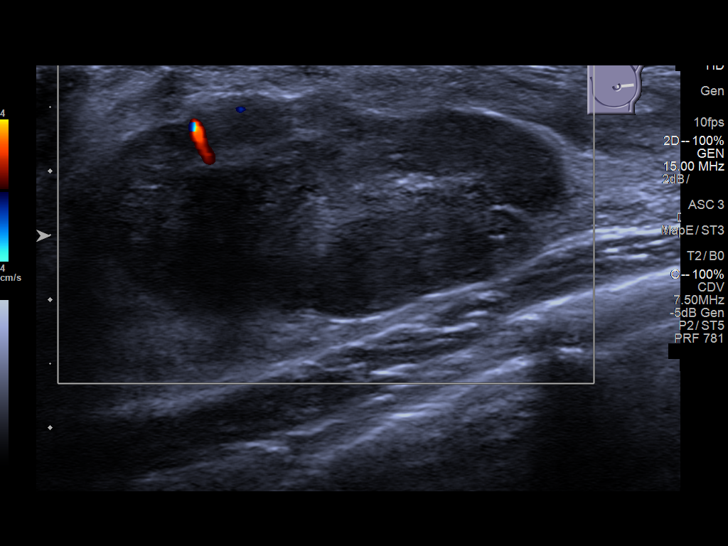
[im 4/11]
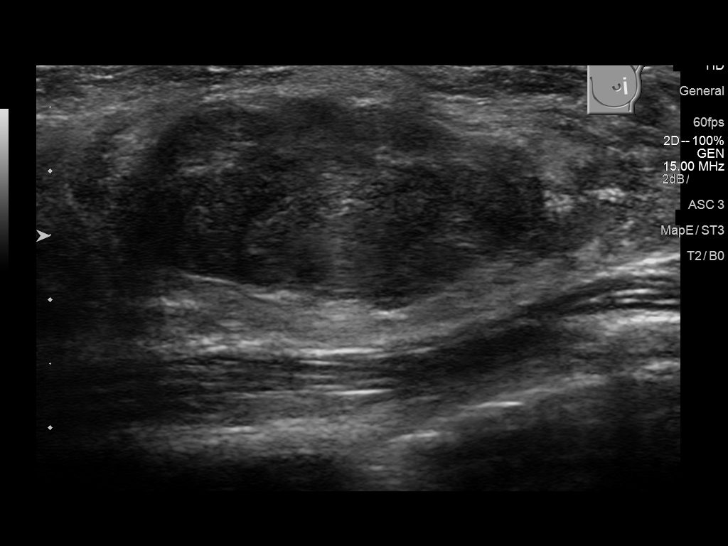
[im 5/11]
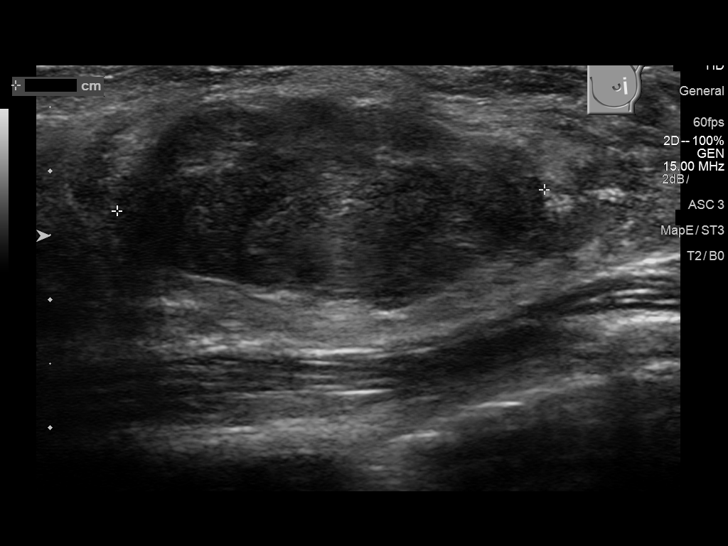
[im 6/11]
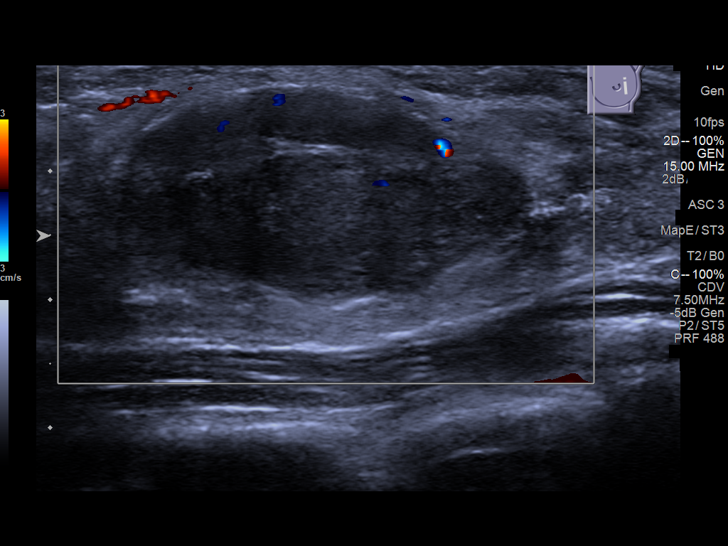
[im 7/11]
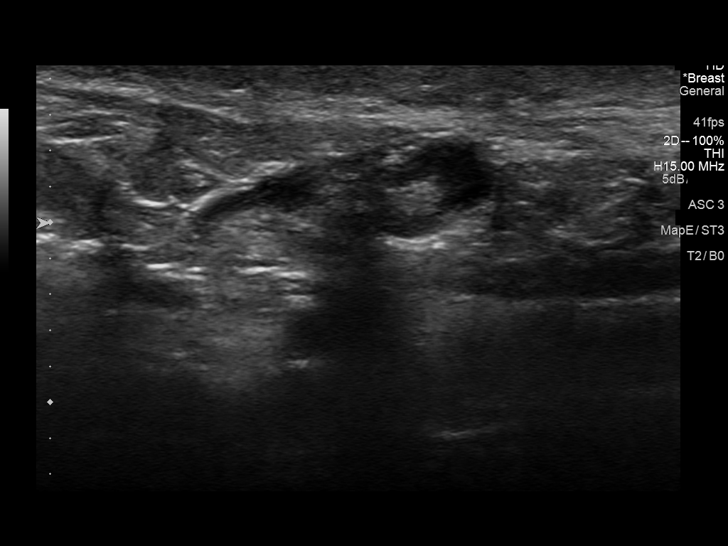
[im 8/11]
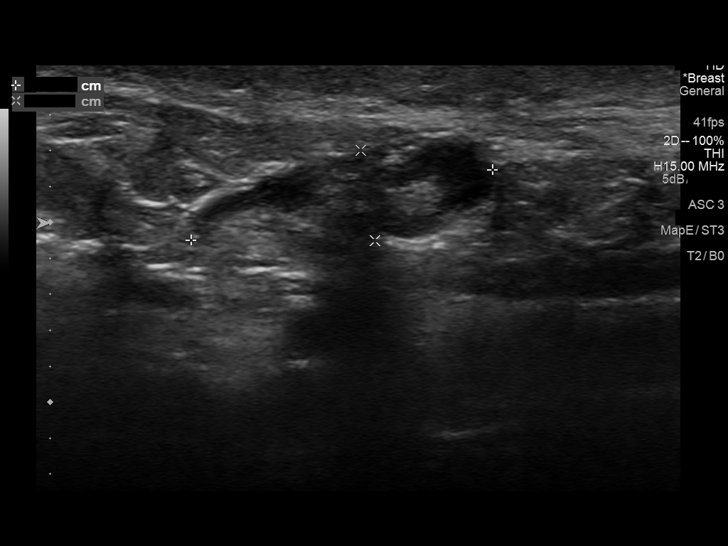
[im 9/11]
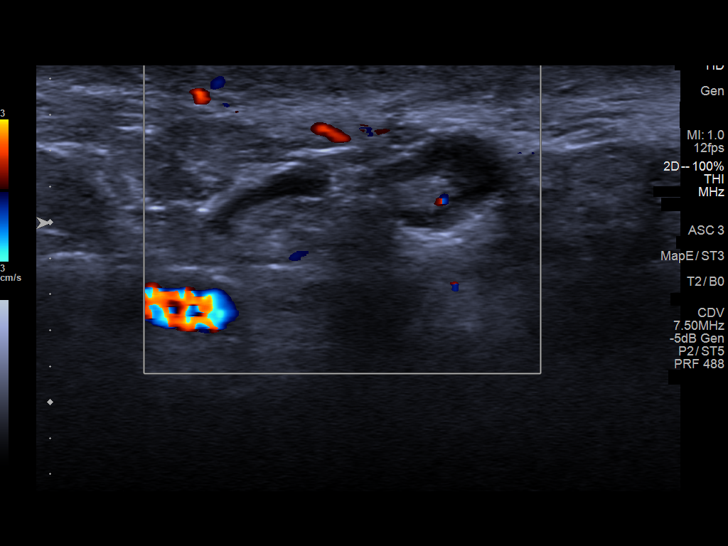
[im 10/11]
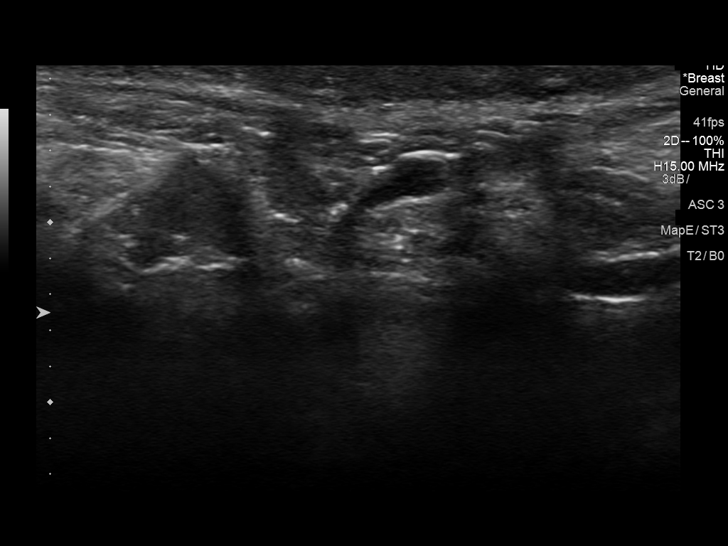
[im 11/11]
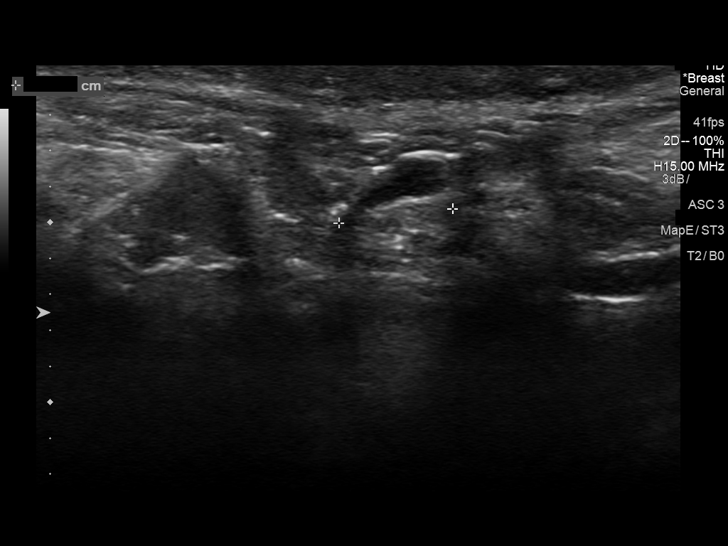

[11 of 11 positions shown; findings below may reference images not displayed]

FINDINGS: On physical exam, there is a smooth palpable mobile mass in the
lateral left breast.

Targeted ultrasound is performed, showing a hypoechoic oval
circumscribed mass in the 2:30 o'clock position of the left breast,
2 cm the nipple, measuring 3.8 x 1.8 x 3.3 cm in size.
IMPRESSION: Probably benign mass in the left breast at the 2:30 o'clock position
measuring 3.8 cm in greatest dimension. This is most likely a
fibroadenoma. Short-term follow-up is recommended.

RECOMMENDATION:
Left breast ultrasound in 6 months.

I have discussed the findings and recommendations with the patient.
Results were also provided in writing at the conclusion of the
visit. If applicable, a reminder letter will be sent to the patient
regarding the next appointment.

BI-RADS CATEGORY  3: Probably benign.

## 2017-10-30 ENCOUNTER — Ambulatory Visit: Payer: Self-pay | Admitting: Surgery

## 2017-10-30 ENCOUNTER — Ambulatory Visit
Admission: RE | Admit: 2017-10-30 | Discharge: 2017-10-30 | Disposition: A | Discharge status: Home / Self Care | Payer: 59 | Source: Ambulatory Visit | Attending: Surgery | Admitting: Surgery

## 2017-10-30 DIAGNOSIS — N6321 Unspecified lump in the left breast, upper outer quadrant: Secondary | ICD-10-CM | POA: Diagnosis not present

## 2017-10-30 DIAGNOSIS — N6320 Unspecified lump in the left breast, unspecified quadrant: Secondary | ICD-10-CM | POA: Diagnosis present

## 2017-11-06 ENCOUNTER — Telehealth: Payer: Self-pay

## 2017-11-06 NOTE — Telephone Encounter (Signed)
I had called the patient to ask her why she had cancelled her appointment that she had scheduled and she stated that she didn't think that her appointment was necessary. I told her that it was important and that we needed to see her. However, she insisted of not coming in.   I will let her provider know.

## 2018-02-13 ENCOUNTER — Encounter: Payer: Self-pay | Admitting: Family Medicine

## 2018-02-16 ENCOUNTER — Ambulatory Visit: Payer: 59 | Admitting: Family Medicine

## 2018-02-20 ENCOUNTER — Encounter: Payer: Self-pay | Admitting: Family Medicine

## 2018-02-20 ENCOUNTER — Ambulatory Visit (INDEPENDENT_AMBULATORY_CARE_PROVIDER_SITE_OTHER): Payer: 59 | Admitting: Family Medicine

## 2018-02-20 VITALS — BP 100/60 | HR 98 | Ht 60.0 in | Wt 95.0 lb

## 2018-02-20 DIAGNOSIS — Z0000 Encounter for general adult medical examination without abnormal findings: Secondary | ICD-10-CM

## 2018-02-20 DIAGNOSIS — M24852 Other specific joint derangements of left hip, not elsewhere classified: Secondary | ICD-10-CM

## 2018-02-20 MED ORDER — MELOXICAM 7.5 MG PO TABS: 7.5000 mg | ORAL_TABLET | Freq: Two times a day (BID) | ORAL | 0 refills | Status: DC

## 2018-02-20 NOTE — Patient Instructions (Addendum)
Snapping Hip Syndrome Rehab Ask your health care provider which exercises are safe for you. Do exercises exactly as told by your health care provider and adjust them as directed. It is normal to feel mild stretching, pulling, tightness, or discomfort as you do these exercises, but you should stop right away if you feel sudden pain or your pain gets worse. Do not begin these exercises until told by your health care provider. Stretching and range of motion exercises These exercises warm up your muscles and joints and improve the movement and flexibility of your hip and pelvis. These exercises also help to relieve pain and stiffness. Exercise A: Hip rotators  Lie on your back on a firm surface. Pull your left / right knee toward your same shoulder with your left / right hand until your knee is pointing toward the ceiling. Hold your left / right ankle with your other hand. Keeping your knee steady, gently pull your ankle toward your other shoulder until you feel a stretch in your buttocks. Hold this position for __________ seconds. Slowly return to the starting position. Repeat __________ times. Complete this stretch __________ times a day. Exercise B: Iliotibial band  Lie on your side with your left / right leg in the top position. Bend your left / right knee and grab your ankle. Slowly bring your knee back so your thigh is behind your body. Slowly lower your knee toward the floor until you feel a gentle stretch on the outside of your left / right thigh. If you do not feel a stretch and your knee will not fall farther, place the heel of your other foot on top of your outer knee and pull your thigh down farther. Hold this position for __________ seconds. Slowly return to the starting position. Repeat __________ times. Complete this stretch __________ times a day. Exercise C: Lunge ( hip flexors) Kneel on the floor on your left / right knee. Bend your other knee so it is directly over your  ankle. Keep good posture with your head over your shoulders. Tuck your tailbone underneath you. This will prevent your back from arching too much. You should feel a gentle stretch in the front of your thigh or hip. If you do not feel a stretch, slowly lunge forward with your chest up. Hold this position for __________ seconds. Repeat __________ times. Complete this exercise __________ times a day. Strengthening exercises These exercises build strength and endurance in your hip and pelvis. Endurance is the ability to use your muscles for a long time, even after they get tired. Exercise D: Straight leg raises ( hip abductors) Lie on your side so your left / right leg is in the top position. Lie so your head, shoulder, knee, and hip line up. Bend your bottom knee to help you balance. Lift your top leg up 4-6 inches (10-15 cm), keeping your toes pointed straight ahead. Pap Test Why am I having this test? A pap test is sometimes called a pap smear. It is a screening test that is used to check for signs of cancer of the vagina, cervix, and uterus. The test can also identify the presence of infection or precancerous changes. Your health care provider will likely recommend you have this test done on a regular basis. This test may be done: Every 3 years, starting at age 24. Every 5 years, in combination with testing for the presence of human papillomavirus (HPV). More or less often depending on other medical conditions.  What kind of  sample is taken? Using a small cotton swab, plastic spatula, or brush, your health care provider will collect a sample of cells from the surface of your cervix. Your cervix is the opening to your uterus, also called a womb. Secretions from the cervix and vagina may also be collected. How do I prepare for this test? Be aware of where you are in your menstrual cycle. You may be asked to reschedule the test if you are menstruating on the day of the test. You may need to  reschedule if you have a known vaginal infection on the day of the test. You may be asked to avoid douching or taking a bath the day before or the day of the test. Some medicines can cause abnormal test results, such as digitalis and tetracycline. Talk with your health care provider before your test if you take one of these medicines. What do the results mean? Abnormal test results may indicate a number of health conditions. These may include: Cancer. Although pap test results cannot be used to diagnose cancer of the cervix, vagina, or uterus, they may suggest the possibility of cancer. Further tests would be required to determine if cancer is present. Sexually transmitted disease. Fungal infection. Parasite infection. Herpes infection. A condition causing or contributing to infertility.  It is your responsibility to obtain your test results. Ask the lab or department performing the test when and how you will get your results. Contact your health care provider to discuss any questions you have about your results. Talk with your health care provider to discuss your results, treatment options, and if necessary, the need for more tests. Talk with your health care provider if you have any questions about your results. This information is not intended to replace advice given to you by your health care provider. Make sure you discuss any questions you have with your health care provider. Document Released: 11/09/2002 Document Revised: 04/24/2016 Document Reviewed: 01/10/2014 Elsevier Interactive Patient Education  2018 [employer]. Hold this position for __________ seconds. Slowly lower your leg to the starting position. Let your muscles relax completely. Repeat __________ times. Complete this exercise __________ times a day. Exercise E: Hip abductors and rotators, quadruped  Get on your hands and knees on a firm, lightly padded surface. Your hands should be directly below your shoulders, and your  knees should be directly below your hips. Lift your left / right knee out to the side. Keep your knee bent. Do not twist your body. Hold this position for __________ seconds. Slowly lower your leg. Repeat __________ times. Complete this exercise __________ times a day. This information is not intended to replace advice given to you by your health care provider. Make sure you discuss any questions you have with your health care provider. Document Released: 08/19/2005 Document Revised: 04/23/2016 Document Reviewed: 08/01/2015 Elsevier Interactive Patient Education  2018 [employer] Snapping Hip Syndrome Snapping hip syndrome is a condition that causes a "snapping" or "popping" feeling in your hip, especially when you walk, stand up from a chair, or swing your leg. Strong bands of tissue (tendons) attach the muscles in your buttocks, thighs, and pelvis to the bones of your hip. Snapping hip syndrome typically happens when a muscle or tendon moves across a bony part of your hip. Snapping hip can also involve torn or loose structures within the joint. This is less common. Snapping hip syndrome can affect different areas of your hip, including the front, side, or back. What are the causes? This  condition is typically caused by tight tendons or muscles around the hip. This often happens from overuse. What increases the risk? The following factors may make you more likely to develop this condition:  Being younger than age 62.  Being a Horticulturist, commercial, runner, weight lifter, gymnast, or soccer player.  Having had an injury to your hip or pelvis.  Having an abnormally shaped pelvis or leg (deformity).  What are the signs or symptoms? Symptoms of this condition include:  A "snapping" or "popping" sensation in the front, side, or back of the hip when moving your leg. This can cause pain. The pain typically goes away when you stop moving.  Tightness in the hip.  Swelling in the front or side of the  hip.  Leg weakness, especially when trying to lift it up or sideways.  Difficulty getting out of low chairs.  How is this diagnosed? This condition is diagnosed based on your symptoms, medical history, and physical exam. Your health care provider may have you move your leg into certain positions and test your muscle strength. You may have an X-ray, ultrasound, or MRI to rule out other causes of pain. You may also have an injection of numbing medicine to see if that reduces your symptoms. How is this treated? Treatment for this condition includes:  Stopping all activities that cause pain or make your condition worse.  Icing your hip to relieve pain.  Taking NSAIDs or getting corticosteroid injections to reduce pain and swelling.  Doing range-of-motion and strengthening exercises (physical therapy) as told by your health care provider.  You may need surgery to loosen your muscle and tendon or to remove any loose pieces of cartilage if other treatments do not work. Follow these instructions at home:  Take over-the-counter and prescription medicines only as told by your health care provider.  If directed, apply ice to the injured area. ? Put ice in a plastic bag. ? Place a towel between your skin and the bag. ? Leave the ice on for 20 minutes, 2-3 times a day.  Do physical therapy as told by your health care provider.  Return to your normal activities as told by your health care provider. Ask your health care provider what activities are safe for you.  Keep all follow-up visits as told by your health care provider. This is important. How is this prevented?  Warm up and stretch before being active.  Cool down and stretch after being active.  Give your body time to rest between periods of activity.  Maintain physical fitness, including: ? Strength. ? Flexibility. Contact a health care provider if:  You have pain, swelling, and difficulty moving that gets worse.  Your leg or  hip feels weak and feels like it cannot hold you up.  You cannot stand or walk without severe pain. This information is not intended to replace advice given to you by your health care provider. Make sure you discuss any questions you have with your health care provider. Document Released: 08/19/2005 Document Revised: 04/23/2016 Document Reviewed: 08/01/2015 Elsevier Interactive Patient Education  [employer].

## 2018-02-20 NOTE — Assessment & Plan Note (Signed)
Previously diagnosed, now with recent flare. Will prescribe meloxicam 7.5 mg 1-2 times a day and rehab exercises. Next step ortho referral

## 2018-02-20 NOTE — Progress Notes (Signed)
Name: Selena DibbleKamilah Rodelo   MRN: [id number]    DOB: 06/29/1996   Date:02/20/2018       Progress Note  Subjective  Chief Complaint  Chief Complaint  Patient presents with  . Hip Pain    L) hip pain started 3 years ago, but is getting started again x 2 weeks. 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 Has not seen ortho for this    Hip Pain   The incident occurred more than 1 week ago (2 weeks). There was no injury mechanism. The pain is present in the left hip. The quality of the pain is described as stabbing. The pain is at a severity of 8/10. The pain is moderate. The pain has been intermittent since onset. Pertinent negatives include no inability to bear weight, muscle weakness or tingling. The symptoms are aggravated by palpation. She has tried non-weight bearing for the symptoms. The treatment provided moderate relief.    Snapping hip syndrome Previously diagnosed, now with recent flare. Will prescribe meloxicam 7.5 mg 1-2 times a day and rehab exercises. Next step ortho referral   Past Medical History:  Diagnosis Date  . Allergy   . Breast mass   . Concussion    x 2    Past Surgical History:  Procedure Laterality Date  . NO PAST SURGERIES      Family History  Problem Relation Age of Onset  . Hypertension Mother   . Diabetes Paternal Grandmother   . Breast cancer Maternal Aunt     Social History   Socioeconomic History  . Marital status: Single    Spouse name: Not on file  . Number of children: Not on file  . Years of education: Not on file  . Highest education level: Not on file  Occupational History  . Not on file  Social Needs  . Financial resource strain: Not on file  . Food insecurity:    Worry: Not on file    Inability: Not on file  . Transportation needs:    Medical: Not on file    Non-medical: Not on file  Tobacco Use  . Smoking status: Never Smoker  . Smokeless tobacco: Never Used  Substance and Sexual Activity  . Alcohol use: No  .  Drug use: Yes    Types: Marijuana    Comment: Last Use Yesterday- per patient 05/06/17  . Sexual activity: Yes  Lifestyle  . Physical activity:    Days per week: Not on file    Minutes per session: Not on file  . Stress: Not on file  Relationships  . Social connections:    Talks on phone: Not on file    Gets together: Not on file    Attends religious service: Not on file    Active member of club or organization: Not on file    Attends meetings of clubs or organizations: Not on file    Relationship status: Not on file  . Intimate partner violence:    Fear of current or ex partner: Not on file    Emotionally abused: Not on file    Physically abused: Not on file    Forced sexual activity: Not on file  Other Topics Concern  . Not on file  Social History Narrative  . Not on file    No Known Allergies  Outpatient Medications Prior to Visit  Medication Sig Dispense Refill  . pantoprazole (PROTONIX) 40 MG tablet Take 1 tablet (40 mg total) by mouth  daily. 30 tablet 3   No facility-administered medications prior to visit.     Review of Systems  Constitutional: Negative for chills, fever, malaise/fatigue and weight loss.  HENT: Negative for ear discharge, ear pain and sore throat.   Eyes: Negative for blurred vision.  Respiratory: Negative for cough, sputum production, shortness of breath and wheezing.   Cardiovascular: Negative for chest pain, palpitations and leg swelling.  Gastrointestinal: Negative for abdominal pain, blood in stool, constipation, diarrhea, heartburn, melena and nausea.  Genitourinary: Negative for dysuria, frequency, hematuria and urgency.  Musculoskeletal: Negative for back pain, joint pain, myalgias and neck pain.  Skin: Negative for rash.  Neurological: Negative for dizziness, tingling, sensory change, focal weakness and headaches.  Endo/Heme/Allergies: Negative for environmental allergies and polydipsia. Does not bruise/bleed easily.   Psychiatric/Behavioral: Negative for depression and suicidal ideas. The patient is not nervous/anxious and does not have insomnia.      Objective  Vitals:   02/20/18 1511  BP: 100/60  Pulse: 98  Weight: 95 lb (43.1 kg)  Height: 5' (1.524 m)    Physical Exam  Constitutional: She is oriented to person, place, and time. She appears well-developed and well-nourished.  HENT:  Head: Normocephalic.  Right Ear: External ear normal.  Left Ear: External ear normal.  Mouth/Throat: Oropharynx is clear and moist.  Eyes: Pupils are equal, round, and reactive to light. Conjunctivae and EOM are normal. Lids are everted and swept, no foreign bodies found. Left eye exhibits no hordeolum. No foreign body present in the left eye. Right conjunctiva is not injected. Left conjunctiva is not injected. No scleral icterus.  Neck: Normal range of motion. Neck supple. No JVD present. No tracheal deviation present. No thyromegaly present.  Cardiovascular: Normal rate, regular rhythm, normal heart sounds and intact distal pulses. Exam reveals no gallop and no friction rub.  No murmur heard. Pulmonary/Chest: Effort normal and breath sounds normal. No respiratory distress. She has no wheezes. She has no rales.  Abdominal: Soft. Bowel sounds are normal. She exhibits no mass. There is no hepatosplenomegaly. There is no tenderness. There is no rebound and no guarding.  Musculoskeletal: Normal range of motion. She exhibits no edema.       Left hip: She exhibits tenderness.  Lymphadenopathy:    She has no cervical adenopathy.  Neurological: She is alert and oriented to person, place, and time. She has normal strength. She displays normal reflexes. No cranial nerve deficit.  Skin: Skin is warm. No rash noted.  Psychiatric: She has a normal mood and affect. Her mood appears not anxious. She does not exhibit a depressed mood.  Nursing note and vitals reviewed.     Assessment & Plan  Problem List Items Addressed  This Visit      Musculoskeletal and Integument   Snapping hip syndrome - Primary    Previously diagnosed, now with recent flare. Will prescribe meloxicam 7.5 mg 1-2 times a day and rehab exercises. Next step ortho referral      Relevant Medications   meloxicam (MOBIC) 7.5 MG tablet    Other Visit Diagnoses    Healthcare maintenance       discussed pap smear and screen for chlamydia/patient declined chlamydia screen      Meds ordered this encounter  Medications  . meloxicam (MOBIC) 7.5 MG tablet    Sig: Take 1 tablet (7.5 mg total) by mouth 2 (two) times daily.    Dispense:  30 tablet    Refill:  0  Dr. Hayden [facility]  02/20/18

## 2018-03-02 IMAGING — US US BREAST*L* LIMITED INC AXILLA
1 series · 6 of 6 positions shown · non-contrast
Comparison: Previous exam(s).

CLINICAL DATA: Short-term follow-up left breast mass

EXAM:
ULTRASOUND OF THE LEFT BREAST

[Series 1: us breast*left* limited inc axilla · 0.06mm/px · 6 of 6 slices shown]
[im 1/6]
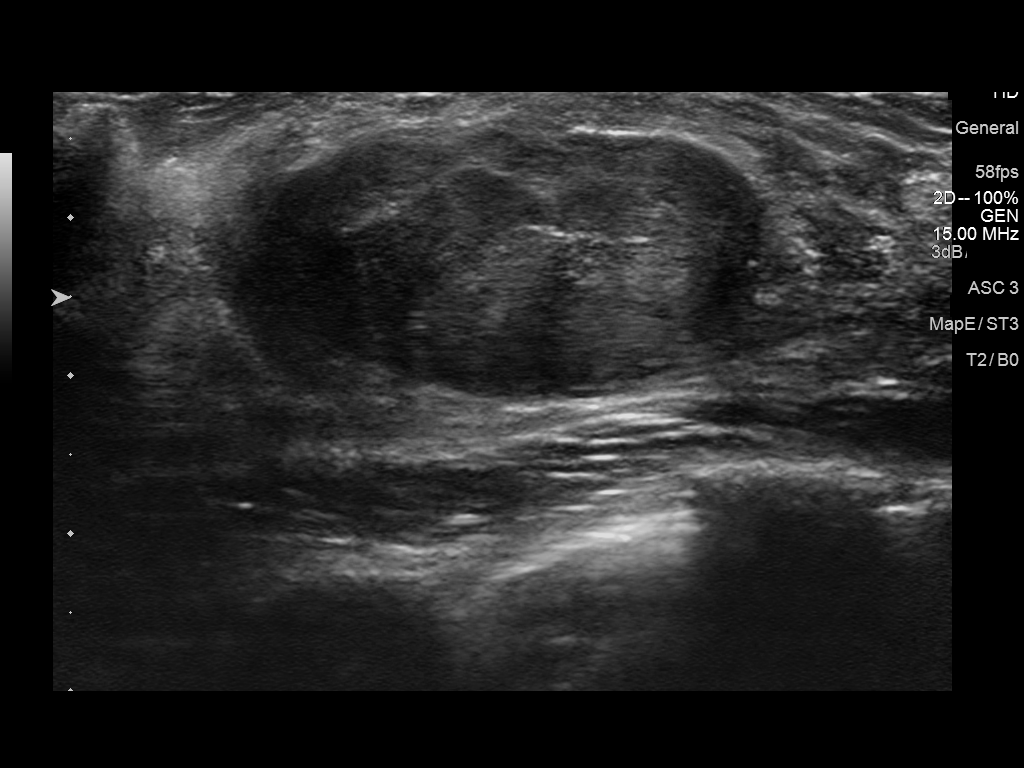
[im 2/6]
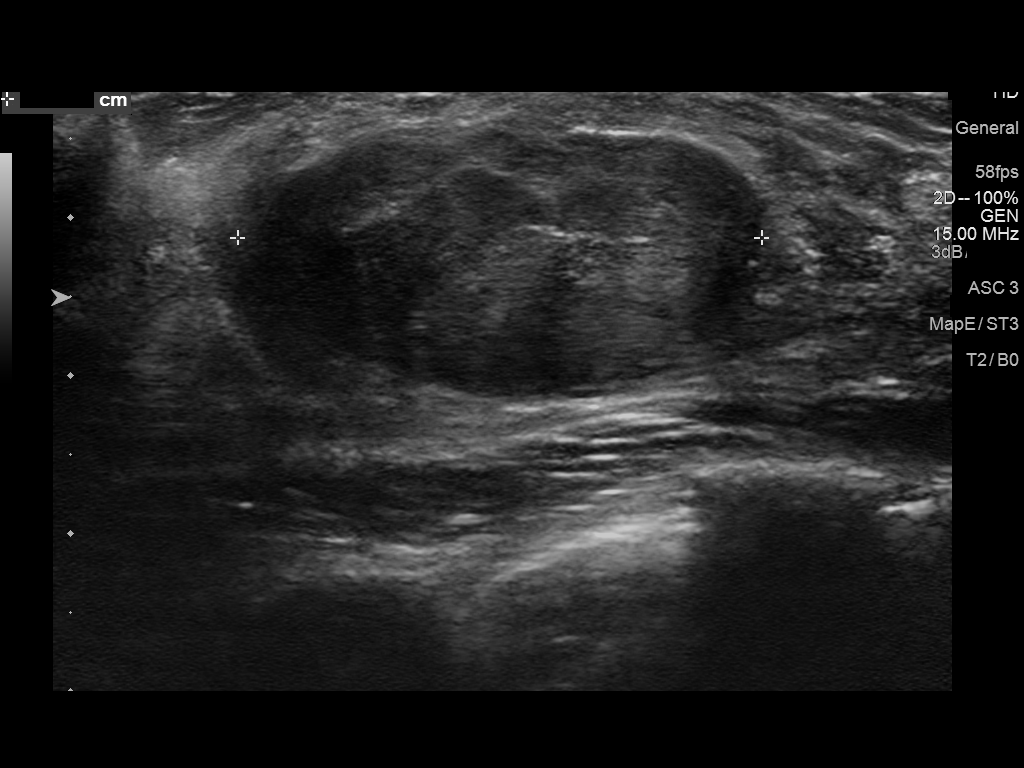
[im 3/6]
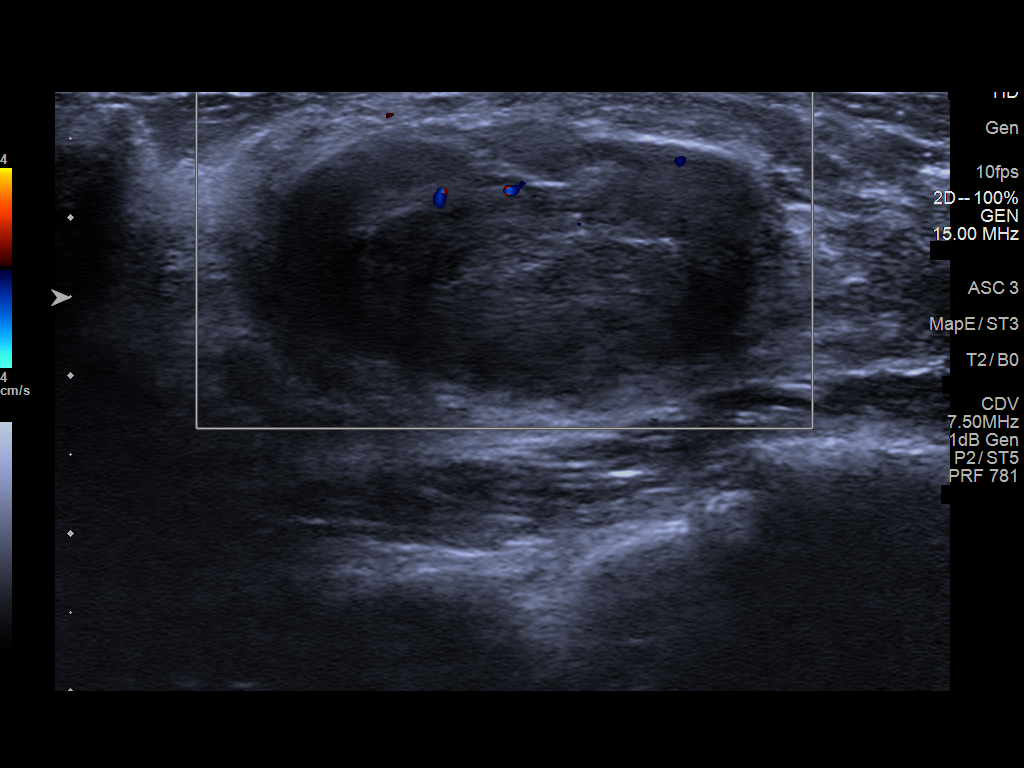
[im 4/6]
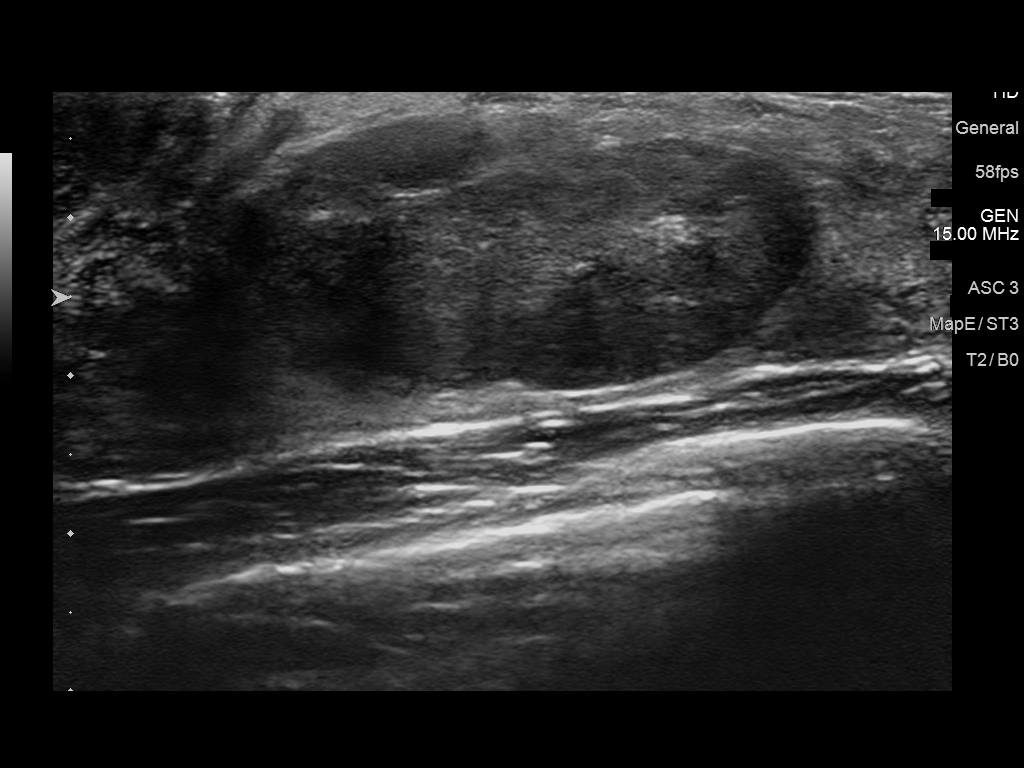
[im 5/6]
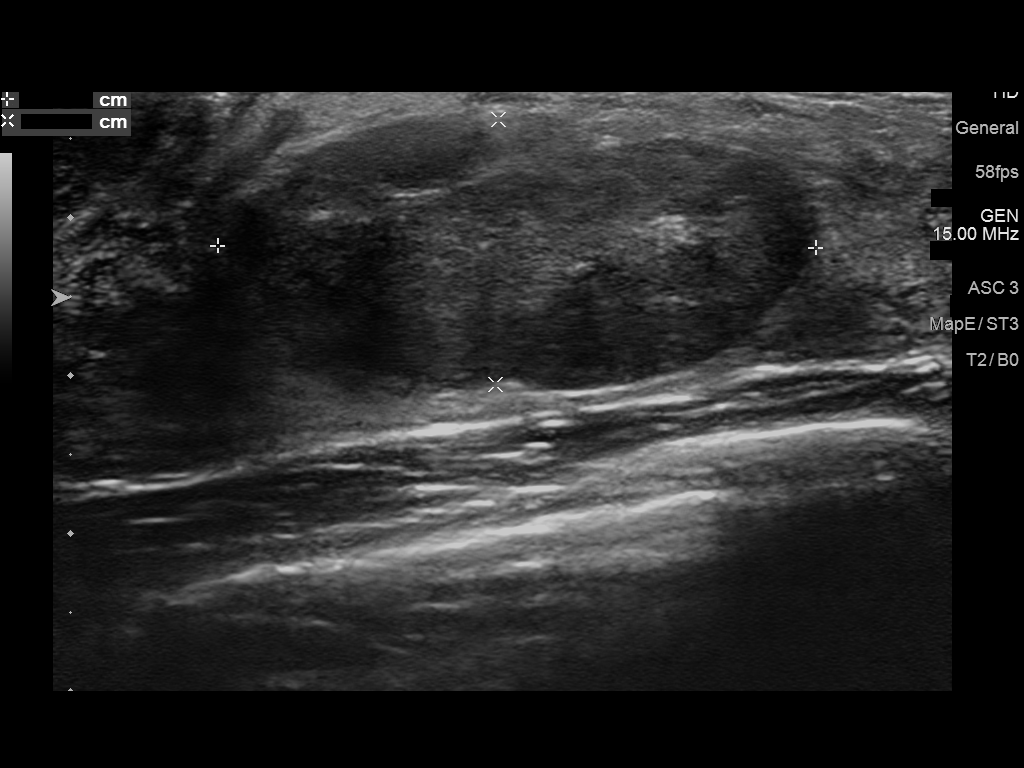
[im 6/6]
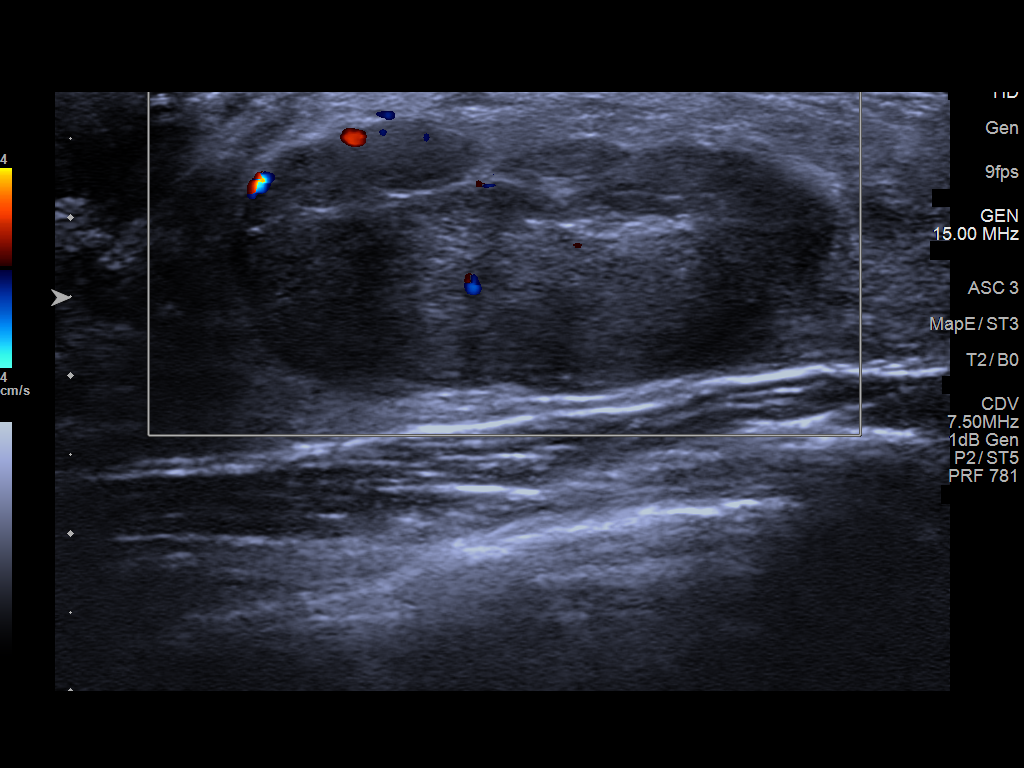

[6 of 6 positions shown; findings below may reference images not displayed]

FINDINGS: Targeted ultrasound is performed, showing 3.8 x 1.7 x 3.3 cm oval
hypoechoic lesion at the left breast 2:30 o'clock 2 cm from nipple
unchanged compared prior exam.
IMPRESSION: Probable benign findings.

RECOMMENDATION:
Six-month follow-up ultrasound left breast.

I have discussed the findings and recommendations with the patient.
Results were also provided in writing at the conclusion of the
visit. If applicable, a reminder letter will be sent to the patient
regarding the next appointment.

BI-RADS CATEGORY  3: Probably benign.

## 2018-06-03 ENCOUNTER — Ambulatory Visit
Admission: EM | Admit: 2018-06-03 | Discharge: 2018-06-03 | Disposition: A | Discharge status: Home / Self Care | Payer: 59 | Attending: Family Medicine | Admitting: Family Medicine

## 2018-06-03 ENCOUNTER — Encounter: Payer: Self-pay | Admitting: Emergency Medicine

## 2018-06-03 ENCOUNTER — Other Ambulatory Visit: Payer: Self-pay

## 2018-06-03 DIAGNOSIS — L03213 Periorbital cellulitis: Secondary | ICD-10-CM | POA: Diagnosis not present

## 2018-06-03 DIAGNOSIS — H00012 Hordeolum externum right lower eyelid: Secondary | ICD-10-CM

## 2018-06-03 MED ORDER — SULFAMETHOXAZOLE-TRIMETHOPRIM 800-160 MG PO TABS: 1.0000 | ORAL_TABLET | Freq: Two times a day (BID) | ORAL | 0 refills | Status: DC

## 2018-06-03 NOTE — ED Triage Notes (Signed)
Patient in today c/o a painful stye on her right lower eyelid that appeared on Sunday (05/31/18).

## 2018-06-03 NOTE — ED Provider Notes (Signed)
[facility]    CSN: [id number] Arrival date & time: 06/03/18  4098     History   Chief Complaint Chief Complaint  Patient presents with  . Stye    APPT    HPI Selena Barton is a 22 y.o. female.   22 yo female with a c/o "stye" on right lower eyelid for 4 days not resolved with warm compresses and now more swelling, redness and pain to the eyelid. Denies any fevers, chills, or vision changes.   The history is provided by the patient.    Past Medical History:  Diagnosis Date  . Allergy   . Breast mass   . Concussion    x 2    Patient Active Problem List   Diagnosis Date Noted  . Post concussion syndrome 07/10/2017  . Headache disorder 07/10/2017  . Allergy   . Concussion   . Left breast mass 04/24/2017  . Snapping hip syndrome 11/13/2013    Past Surgical History:  Procedure Laterality Date  . NO PAST SURGERIES      OB History   None      Home Medications    Prior to Admission medications   Medication Sig Start Date End Date Taking? Authorizing Provider  meloxicam (MOBIC) 7.5 MG tablet Take 1 tablet (7.5 mg total) by mouth 2 (two) times daily. 02/20/18   Duanne Limerick, MD  sulfamethoxazole-trimethoprim (BACTRIM DS,SEPTRA DS) 800-160 MG tablet Take 1 tablet by mouth 2 (two) times daily. 06/03/18   Payton Mccallum, MD    Family History Family History  Problem Relation Age of Onset  . Hypertension Mother   . Diabetes Paternal Grandmother   . Breast cancer Maternal Aunt   . Healthy Father     Social History Social History   Tobacco Use  . Smoking status: Never Smoker  . Smokeless tobacco: Never Used  Substance Use Topics  . Alcohol use: No  . Drug use: Yes    Frequency: 3.0 times per week    Types: Marijuana     Allergies   Patient has no known allergies.   Review of Systems Review of Systems   Physical Exam Triage Vital Signs ED Triage Vitals  Enc Vitals Group     BP 06/03/18 0850 92/71     Pulse Rate 06/03/18  0850 64     Resp 06/03/18 0850 16     Temp 06/03/18 0850 98.7 F (37.1 C)     Temp Source 06/03/18 0850 Oral     SpO2 06/03/18 0850 100 %     Weight 06/03/18 0850 95 lb (43.1 kg)     Height 06/03/18 0850 5\' 1"  (1.549 m)     Head Circumference --      Peak Flow --      Pain Score 06/03/18 0849 5     Pain Loc --      Pain Edu? --      Excl. in GC? --    No data found.  Updated Vital Signs BP 92/71 (BP Location: Left Arm)   Pulse 64   Temp 98.7 F (37.1 C) (Oral)   Resp 16   Ht 5\' 1"  (1.549 m)   Wt 43.1 kg   LMP 05/25/2018   SpO2 100%   BMI 17.95 kg/m   Visual Acuity Right Eye Distance: 20/40 Left Eye Distance: 20/25 Bilateral Distance: 20/25  Right Eye Near:   Left Eye Near:    Bilateral Near:     Physical Exam  Constitutional: She appears well-developed and well-nourished. No distress.  Eyes: Pupils are equal, round, and reactive to light. Conjunctivae and EOM are normal. Right eye exhibits hordeolum (right lower eyelid). Right eye exhibits no discharge. Left eye exhibits no discharge.  Diffuse erythema, edema and mild tenderness to right lower eyelid  Skin: She is not diaphoretic.  Nursing note and vitals reviewed.    UC Treatments / Results  Labs (all labs ordered are listed, but only abnormal results are displayed) Labs Reviewed - No data to display  EKG None  Radiology No results found.  Procedures Procedures (including critical care time)  Medications Ordered in UC Medications - No data to display  Initial Impression / Assessment and Plan / UC Course  I have reviewed the triage vital signs and the nursing notes.  Pertinent labs & imaging results that were available during my care of the patient were reviewed by me and considered in my medical decision making (see chart for details).      Final Clinical Impressions(s) / UC Diagnoses   Final diagnoses:  Preseptal cellulitis of right eye  Hordeolum externum of right lower eyelid    ED  Prescriptions    Medication Sig Dispense Auth. Provider   sulfamethoxazole-trimethoprim (BACTRIM DS,SEPTRA DS) 800-160 MG tablet Take 1 tablet by mouth 2 (two) times daily. 14 tablet Gervis Gaba, Pamala Hurry, MD      1. diagnosis reviewed with patient 2. rx as per orders above; reviewed possible side effects, interactions, risks and benefits  3. Recommend supportive treatment with warm compresses, otc analgesics prn 4. Follow-up prn if symptoms worsen or don't improve  Controlled Substance Prescriptions [place] Controlled Substance Registry consulted? Not Applicable   Payton Mccallum, MD 06/03/18 0930

## 2018-06-10 ENCOUNTER — Ambulatory Visit (INDEPENDENT_AMBULATORY_CARE_PROVIDER_SITE_OTHER): Payer: 59 | Admitting: Family Medicine

## 2018-06-10 ENCOUNTER — Encounter: Payer: Self-pay | Admitting: Family Medicine

## 2018-06-10 VITALS — BP 97/76 | HR 100 | Resp 16 | Ht 61.0 in | Wt 98.0 lb

## 2018-06-10 DIAGNOSIS — H00012 Hordeolum externum right lower eyelid: Secondary | ICD-10-CM | POA: Diagnosis not present

## 2018-06-10 NOTE — Progress Notes (Signed)
    Date:  06/10/2018   Name:  Selena Barton   DOB:  25-May-1996   MRN:  [id number]   Chief Complaint: Stye (right eye- came up about 10 days ago and she finished 7 day Sulfa yestersay and it went down a tiny bit but still affecting her vision. Tried tea bags and cold compresses. ) and Ear Pain (right ear pain ) Eye Problem   The right eye is affected. This is a new problem. The current episode started in the past 7 days. The problem occurs constantly. The problem has been unchanged. There was no injury mechanism. The pain is mild. Associated symptoms include eye redness. Pertinent negatives include no blurred vision, eye discharge, double vision, fever, foreign body sensation, itching, nausea or recent URI. Treatments tried: antibiotic. The treatment provided mild relief.     Review of Systems  Constitutional: Negative for fever.  Eyes: Positive for redness. Negative for blurred vision, double vision, discharge and itching.  Gastrointestinal: Negative for nausea.    Patient Active Problem List   Diagnosis Date Noted  . Post concussion syndrome 07/10/2017  . Headache disorder 07/10/2017  . Allergy   . Concussion   . Left breast mass 04/24/2017  . 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 NO PAST SURGERIES      Social History   Tobacco Use  . Smoking status: Never Smoker  . Smokeless tobacco: Never Used  Substance Use Topics  . Alcohol use: No  . Drug use: Yes    Frequency: 3.0 times per week    Types: Marijuana     Medication list has been reviewed and updated.  Current Meds  Medication Sig  . [DISCONTINUED] meloxicam (MOBIC) 7.5 MG tablet Take 1 tablet (7.5 mg total) by mouth 2 (two) times daily.  . [DISCONTINUED] sulfamethoxazole-trimethoprim (BACTRIM DS,SEPTRA DS) 800-160 MG tablet Take 1 tablet by mouth 2 (two) times daily.    PHQ 2/9 Scores 02/20/2018 04/22/2017  PHQ - 2 Score 0 2  PHQ- 9  Score 1 4    Physical Exam  BP 97/76   Pulse 100   Resp 16   Ht 5\' 1"  (1.549 m)   Wt 98 lb (44.5 kg)   LMP 05/25/2018   SpO2 100%   BMI 18.52 kg/m   Assessment and Plan:  1. Hordeolum externum of right lower eyelid Acute Persistent. Unresolved with Bactrim DS. Will refer to ophthalmology for possible I&D. - Ambulatory referral to Ophthalmology    Dr. Elizabeth Sauer [facility]  06/10/2018

## 2018-12-02 ENCOUNTER — Telehealth: Payer: 59 | Admitting: Nurse Practitioner

## 2018-12-02 DIAGNOSIS — R112 Nausea with vomiting, unspecified: Secondary | ICD-10-CM | POA: Diagnosis not present

## 2018-12-02 MED ORDER — ONDANSETRON HCL 4 MG PO TABS: 4.0000 mg | ORAL_TABLET | Freq: Three times a day (TID) | ORAL | 0 refills | Status: DC | PRN

## 2018-12-02 NOTE — Progress Notes (Signed)
We are sorry that you are not feeling well. Here is how we plan to help!  Based on what you have shared with me it looks like you have a Virus that is irritating your GI tract.  Vomiting is the forceful emptying of a portion of the stomach's content through the mouth.  Although nausea and vomiting can make you feel miserable, it's important to remember that these are not diseases, but rather symptoms of an underlying illness.  When we treat short term symptoms, we always caution that any symptoms that persist should be fully evaluated in a medical office.  I have prescribed a medication that will help alleviate your symptoms and allow you to stay hydrated:  Zofran 4 mg 1 tablet every 8 hours as needed for nausea and vomiting  HOME CARE:  Drink clear liquids.  This is very important! Dehydration (the lack of fluid) can lead to a serious complication.  Start off with 1 tablespoon every 5 minutes for 8 hours.  You may begin eating bland foods after 8 hours without vomiting.  Start with saltine crackers, white bread, rice, mashed potatoes, applesauce.  After 48 hours on a bland diet, you may resume a normal diet.  Try to go to sleep.  Sleep often empties the stomach and relieves the need to vomit.  GET HELP RIGHT AWAY IF:   Your symptoms do not improve or worsen within 2 days after treatment.  You have a fever for over 3 days.  You cannot keep down fluids after trying the medication.  MAKE SURE YOU:   Understand these instructions.  Will watch your condition.  Will get help right away if you are not doing well or get worse.   Thank you for choosing an e-visit. Your e-visit answers were reviewed by a board certified advanced clinical practitioner to complete your personal care plan. Depending upon the condition, your plan could have included both over the counter or prescription medications. Please review your pharmacy choice. Be sure that the pharmacy you have chosen is open so  that you can pick up your prescription now.  If there is a problem you may message your provider in MyChart to have the prescription routed to another pharmacy. Your safety is important to us. If you have drug allergies check your prescription carefully.  For the next 24 hours, you can use MyChart to ask questions about today's visit, request a non-urgent call back, or ask for a work or school excuse from your e-visit provider. You will get an e-mail in the next two days asking about your experience. I hope that your e-visit has been valuable and will speed your recovery.  5 minutes spent reviewing and documenting in chart.   

## 2018-12-08 ENCOUNTER — Ambulatory Visit (INDEPENDENT_AMBULATORY_CARE_PROVIDER_SITE_OTHER): Payer: 59 | Admitting: Family Medicine

## 2018-12-08 ENCOUNTER — Encounter: Payer: Self-pay | Admitting: Family Medicine

## 2018-12-08 ENCOUNTER — Other Ambulatory Visit: Payer: Self-pay

## 2018-12-08 VITALS — BP 100/60 | HR 72 | Ht 61.0 in | Wt 92.6 lb

## 2018-12-08 DIAGNOSIS — K2900 Acute gastritis without bleeding: Secondary | ICD-10-CM

## 2018-12-08 DIAGNOSIS — F419 Anxiety disorder, unspecified: Secondary | ICD-10-CM

## 2018-12-08 DIAGNOSIS — E860 Dehydration: Secondary | ICD-10-CM

## 2018-12-08 DIAGNOSIS — R112 Nausea with vomiting, unspecified: Secondary | ICD-10-CM

## 2018-12-08 MED ORDER — PANTOPRAZOLE SODIUM 40 MG PO TBEC: 40.0000 mg | DELAYED_RELEASE_TABLET | Freq: Every day | ORAL | 3 refills | Status: DC

## 2018-12-08 MED ORDER — BUSPIRONE HCL 5 MG PO TABS: 5.0000 mg | ORAL_TABLET | Freq: Two times a day (BID) | ORAL | 0 refills | Status: DC

## 2018-12-08 NOTE — Progress Notes (Signed)
Date:  12/08/2018   Name:  Selena Barton   DOB:  12/27/1995   MRN:  [id number]   Chief Complaint: Anxiety (before eating, starts feeling nauseated and this leads to getting hot and having shaking. Feels like she can't eat without this happening. Last episode of vomitting was 6 days ago. Zofran is helping with the nausea. No more dark stools. PHQ9=9 and GAD7=16)  Anxiety  Presents for initial visit. The problem has been waxing and waning. Symptoms include dizziness, dry mouth, excessive worry, insomnia, nausea, nervous/anxious behavior and panic. Patient reports no chest pain, compulsions, confusion, decreased concentration, depressed mood, feeling of choking, hyperventilation, impotence, irritability, malaise, muscle tension, obsessions, palpitations, restlessness, shortness of breath or suicidal ideas. The severity of symptoms is moderate. The symptoms are aggravated by medication withdrawal. The quality of sleep is non-restorative.   There is no history of anemia, anxiety/panic attacks, arrhythmia, asthma, bipolar disorder, CAD, CHF, chronic lung disease, depression, fibromyalgia, hyperthyroidism or suicide attempts. Past treatments include nothing. Compliance with prior treatments has been variable.    Review of Systems  Constitutional: Negative.  Negative for chills, fatigue, fever, irritability and unexpected weight change.  HENT: Negative for congestion, ear discharge, ear pain, rhinorrhea, sinus pressure, sneezing and sore throat.   Eyes: Negative for photophobia, pain, discharge, redness and itching.  Respiratory: Negative for cough, shortness of breath, wheezing and stridor.   Cardiovascular: Negative for chest pain and palpitations.  Gastrointestinal: Positive for nausea. Negative for abdominal pain, blood in stool, constipation, diarrhea and vomiting.  Endocrine: Negative for cold intolerance, heat intolerance, polydipsia, polyphagia and polyuria.  Genitourinary: Negative for  dysuria, flank pain, frequency, hematuria, impotence, menstrual problem, pelvic pain, urgency, vaginal bleeding and vaginal discharge.  Musculoskeletal: Negative for arthralgias, back pain and myalgias.  Skin: Negative for rash.  Allergic/Immunologic: Negative for environmental allergies and food allergies.  Neurological: Positive for dizziness. Negative for weakness, light-headedness, numbness and headaches.  Hematological: Negative for adenopathy. Does not bruise/bleed easily.  Psychiatric/Behavioral: Negative for confusion, decreased concentration, dysphoric mood and suicidal ideas. The patient is nervous/anxious and has insomnia.     Patient Active Problem List   Diagnosis Date Noted  . Post concussion syndrome 07/10/2017  . Headache disorder 07/10/2017  . Allergy   . Concussion   . Left breast mass 04/24/2017  . 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 NO PAST SURGERIES      Social History   Tobacco Use  . Smoking status: Never Smoker  . Smokeless tobacco: Never Used  Substance Use Topics  . Alcohol use: No  . Drug use: Yes    Frequency: 3.0 times per week    Types: Marijuana     Medication list has been reviewed and updated.  Current Meds  Medication Sig  . ondansetron (ZOFRAN) 4 MG tablet Take 1 tablet (4 mg total) by mouth every 8 (eight) hours as needed for nausea or vomiting.    PHQ 2/9 Scores 12/08/2018 02/20/2018 04/22/2017  PHQ - 2 Score 0 0 2  PHQ- 9 Score 9 1 4     BP Readings from Last 3 Encounters:  12/08/18 100/60  06/10/18 97/76  06/03/18 92/71    Physical Exam Vitals signs and nursing note reviewed.  Constitutional:      General: She is not in acute distress.    Appearance: She is not diaphoretic.  HENT:     Head: Normocephalic and atraumatic.  Right Ear: Tympanic membrane, ear canal and external ear normal.     Left Ear: Tympanic membrane, ear canal and external ear  normal.     Nose: Nose normal.  Eyes:     General:        Right eye: No discharge.        Left eye: No discharge.     Conjunctiva/sclera: Conjunctivae normal.     Pupils: Pupils are equal, round, and reactive to light.  Neck:     Musculoskeletal: Normal range of motion and neck supple.     Thyroid: No thyromegaly.     Vascular: No JVD.  Cardiovascular:     Rate and Rhythm: Normal rate and regular rhythm.     Heart sounds: Normal heart sounds. No murmur. No friction rub. No gallop.   Pulmonary:     Effort: Pulmonary effort is normal.     Breath sounds: Normal breath sounds.  Abdominal:     General: Abdomen is flat. Bowel sounds are normal.     Palpations: Abdomen is soft. There is no hepatomegaly or mass.     Tenderness: There is no abdominal tenderness. There is no right CVA tenderness, left CVA tenderness, guarding or rebound.  Musculoskeletal: Normal range of motion.  Lymphadenopathy:     Cervical: No cervical adenopathy.  Skin:    General: Skin is warm and dry.  Neurological:     Mental Status: She is alert.     Deep Tendon Reflexes: Reflexes are normal and symmetric.     Wt Readings from Last 3 Encounters:  12/08/18 92 lb 9.6 oz (42 kg)  06/10/18 98 lb (44.5 kg)  06/03/18 95 lb (43.1 kg)    BP 100/60   Pulse 72   Ht 5\' 1"  (1.549 m)   Wt 92 lb 9.6 oz (42 kg)   LMP 12/06/2018 (Exact Date)   BMI 17.50 kg/m   Assessment and Plan: 1. Acute gastritis without hemorrhage, unspecified gastritis type Patient has a long history of gastritis which she is been treated with proton pump inhibitors.  She also notes that a past evaluation had apparently a positive H. pylori for which she had to take antibiotics.  Patient has had a recent recurrence of nausea and vomiting which she underwent a evaluation with telephone medicine.  Patient is to resume pantoprazole 40 mg once a day.  Patient will continue Zofran as needed for nausea and vomiting.  We will send referral to  gastroenterology for evaluation since this is recurrence and persistent. - pantoprazole (PROTONIX) 40 MG tablet; Take 1 tablet (40 mg total) by mouth daily.  Dispense: 30 tablet; Refill: 3 - Ambulatory referral to Gastroenterology  2. Non-intractable vomiting with nausea, unspecified vomiting type Continue Zofran as needed for milligrams as needed for nausea and vomiting. - Ambulatory referral to Gastroenterology  3. Anxiety Patient had a gad score of 16 with a PHQ of 9.  Patient will be initiated on buspirone 5 mg twice a day.  Will return for reevaluation and possible addition of SNRI or SSRI.  4. Dehydration And is having some mild dehydration which is noted by creased moisture to her mucous membranes.  Patient has been encouraged to creased fluid intake.

## 2018-12-15 ENCOUNTER — Other Ambulatory Visit: Payer: Self-pay

## 2018-12-15 ENCOUNTER — Ambulatory Visit (INDEPENDENT_AMBULATORY_CARE_PROVIDER_SITE_OTHER): Payer: 59 | Admitting: Gastroenterology

## 2018-12-15 ENCOUNTER — Encounter: Payer: Self-pay | Admitting: Gastroenterology

## 2018-12-15 DIAGNOSIS — R1013 Epigastric pain: Secondary | ICD-10-CM | POA: Diagnosis not present

## 2018-12-15 DIAGNOSIS — R140 Abdominal distension (gaseous): Secondary | ICD-10-CM | POA: Diagnosis not present

## 2018-12-15 DIAGNOSIS — K219 Gastro-esophageal reflux disease without esophagitis: Secondary | ICD-10-CM | POA: Diagnosis not present

## 2018-12-15 NOTE — Progress Notes (Signed)
Midge Miniumarren Cola Gane, MD [address]  [address]  [place], [place] [address]  Main: [phone]  Fax: [phone]    Gastroenterology Virtual/Video Visit  Referring Provider:     Duanne LimerickJones, Deanna C, MD Primary Care Physician:  Duanne LimerickJones, Deanna C, MD Primary Gastroenterologist:  Dr.Ayssa Bentivegna Servando SnareWohl Reason for Consultation:     Nausea and vomiting with presumed gastritis        HPI:    Virtual Visit via Video Note Location of the patient: Home Location of provider: Office  Participating persons: The patient myself and Ginger Feldpausch.  I connected with Selena Barton on 12/15/18 at 10:30 AM EDT by a video enabled telemedicine application and verified that I am speaking with the correct person using two identifiers.   I discussed the limitations of evaluation and management by telemedicine and the availability of in person appointments. The patient expressed understanding and agreed to proceed.  Verbal consent to proceed obtained.  History of Present Illness: Selena Barton is a 23 y.o. female referred by Dr. Duanne LimerickJones, Deanna C, MD  for consultation & management of nausea and vomiting.  This patient has a history of anxiety and had been seen by her primary care provider with a telephone conference back on the seventh of this month.  At that time the patient was reporting a recent recurrence of her nausea vomiting.  The patient was resumed on her pantoprazole and given Zofran for her nausea.  The patient had reported that she was treated in the past with antibiotics for H. pylori.  The patient has been reporting that she has had a long history of gastritis which has been treated with a proton pump inhibitor in the past.  The patient had an H. pylori test done in October 2018 by Dr. Yetta BarreJones that was negative.  The patient had reported that she started to feel nauseated even before she starts to eat and has been getting the feeling of being hot and having some shaking at that time. The patient states that  she has stopped the Zofran because her nausea has gone away but she has been taking the Protonix in the morning.  She now has some acid breakthrough usually in the morning when she wakes up.  There is no report of any dysphasia.  She also reports that she has been told she had ulcers in the past in addition to being told she had gastritis.  The patient has never had an EGD as reported by her.  Past Medical History:  Diagnosis Date  . Allergy   . Breast mass   . Concussion    x 2  . Stye     Past Surgical History:  Procedure Laterality Date  . NO PAST SURGERIES      Prior to Admission medications   Medication Sig Start Date End Date Taking? Authorizing Provider  busPIRone (BUSPAR) 5 MG tablet Take 1 tablet (5 mg total) by mouth 2 (two) times daily. 12/08/18   Duanne LimerickJones, Deanna C, MD  ondansetron (ZOFRAN) 4 MG tablet Take 1 tablet (4 mg total) by mouth every 8 (eight) hours as needed for nausea or vomiting. 12/02/18   Daphine DeutscherMartin, Mary-Margaret, FNP  pantoprazole (PROTONIX) 40 MG tablet Take 1 tablet (40 mg total) by mouth daily. 12/08/18   Duanne LimerickJones, Deanna C, MD    Family History  Problem Relation Age of Onset  . Hypertension Mother   . Diabetes Paternal Grandmother   . Breast cancer Maternal Aunt   . Healthy Father  Social History   Tobacco Use  . Smoking status: Never Smoker  . Smokeless tobacco: Never Used  Substance Use Topics  . Alcohol use: No  . Drug use: Yes    Frequency: 3.0 times per week    Types: Marijuana    Allergies as of 12/15/2018  . (No Known Allergies)    Review of Systems:    All systems reviewed and negative except where noted in HPI.   Observations/Objective:  Labs: CBC No results found for: WBC, RBC, HGB, HCT, PLT, MCV, MCH, MCHC, RDW, LYMPHSABS, MONOABS, EOSABS, BASOSABS CMP  No results found for: NA, K, CL, CO2, GLUCOSE, BUN, CREATININE, CALCIUM, PROT, ALBUMIN, AST, ALT, ALKPHOS, BILITOT, GFRNONAA, GFRAA  Imaging Studies: No results found.   Assessment and Plan:   Selena Barton is a 23 y.o. y/o female has been referred for possible gastritis with nausea and vomiting.  The patient was put on pantoprazole and states her nausea has gone away and she only has some acid breakthrough in the morning.  The patient also relates most of her symptoms to anxiety.  She also has a lot of burping and gas and states that she eats quickly and thinks that she may be swallowing a lot of air.  There is no report of any unexplained weight loss fevers chills black stools or bloody stools.  The patient has been told to try and take her Protonix in the evening thereby having a higher level of medication in the morning when she wakes up and decreasing the amount of acid her esophagus exposed to while supine.  The patient will be set up for an EGD once this pandemic is over to evaluate her for possible gastritis peptic ulcer disease or hiatal hernia.    Follow Up Instructions:  I discussed the assessment and treatment plan with the patient. The patient was provided an opportunity to ask questions and all were answered. The patient agreed with the plan and demonstrated an understanding of the instructions.   The patient was advised to call back or seek an in-person evaluation if the symptoms worsen or if the condition fails to improve as anticipated.  I provided 15 minutes of non-face-to-face time during this encounter.   Midge Minium, MD  Speech recognition software was used to dictate the above note.

## 2018-12-30 ENCOUNTER — Ambulatory Visit (INDEPENDENT_AMBULATORY_CARE_PROVIDER_SITE_OTHER): Payer: 59 | Admitting: Family Medicine

## 2018-12-30 ENCOUNTER — Other Ambulatory Visit: Payer: Self-pay

## 2018-12-30 ENCOUNTER — Encounter: Payer: Self-pay | Admitting: Family Medicine

## 2018-12-30 VITALS — Ht 61.0 in | Wt 94.0 lb

## 2018-12-30 DIAGNOSIS — F419 Anxiety disorder, unspecified: Secondary | ICD-10-CM | POA: Diagnosis not present

## 2018-12-30 MED ORDER — BUSPIRONE HCL 5 MG PO TABS: 5.0000 mg | ORAL_TABLET | Freq: Two times a day (BID) | ORAL | 5 refills | Status: DC

## 2018-12-30 NOTE — Progress Notes (Signed)
Date:  12/30/2018   Name:  Selena Barton   DOB:  May 09, 1996   MRN:  [id number]   Chief Complaint: Anxiety (follow up- PHQ9=1 and GAD7=3)  I connected withthis patient, Selena Barton, by telephoneat the patient's home.  I verified that I am speaking with the correct person using two identifiers. This visit was conducted via telephone due to the Covid-19 outbreak from my office at [facility] in [place], [place]. I discussed the limitations, risks, security and privacy concerns of performing an evaluation and management service by telephone. I also discussed with the patient that there may be a patient responsible charge related to this service. The patient expressed understanding and agreed to proceed.  Anxiety  Presents for follow-up visit. Symptoms include excessive worry and nervous/anxious behavior. Patient reports no chest pain, compulsions, confusion, decreased concentration, depressed mood, dizziness, dry mouth, feeling of choking, hyperventilation, impotence, insomnia, irritability, malaise, muscle tension, nausea, obsessions, palpitations, panic, restlessness, shortness of breath or suicidal ideas. Primary symptoms comment: weight gain/up 1-2 lbs. Symptoms occur most days. The severity of symptoms is mild. The quality of sleep is good.      Review of Systems  Constitutional: Negative.  Negative for chills, fatigue, fever, irritability and unexpected weight change.  HENT: Negative for congestion, ear discharge, ear pain, rhinorrhea, sinus pressure, sneezing and sore throat.   Eyes: Negative for photophobia, pain, discharge, redness and itching.  Respiratory: Negative for apnea, cough, choking, chest tightness, shortness of breath, wheezing and stridor.   Cardiovascular: Negative for chest pain and palpitations.  Gastrointestinal: Negative for abdominal pain, blood in stool, constipation, diarrhea, nausea and vomiting.  Endocrine: Negative for cold intolerance, heat  intolerance, polydipsia, polyphagia and polyuria.  Genitourinary: Negative for dysuria, flank pain, frequency, hematuria, impotence, menstrual problem, pelvic pain, urgency, vaginal bleeding and vaginal discharge.  Musculoskeletal: Negative for arthralgias, back pain and myalgias.  Skin: Negative for rash.  Allergic/Immunologic: Negative for environmental allergies and food allergies.  Neurological: Positive for headaches. Negative for dizziness, syncope, speech difficulty, weakness, light-headedness and numbness.  Hematological: Negative for adenopathy. Does not bruise/bleed easily.  Psychiatric/Behavioral: Positive for agitation. Negative for behavioral problems, confusion, decreased concentration, dysphoric mood, hallucinations, self-injury, sleep disturbance and suicidal ideas. The patient is nervous/anxious. The patient does not have insomnia and is not hyperactive.        Irratable    Patient Active Problem List   Diagnosis Date Noted  . Post concussion syndrome 07/10/2017  . Headache disorder 07/10/2017  . Allergy   . Concussion   . Left breast mass 04/24/2017  . 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 NO PAST SURGERIES      Social History   Tobacco Use  . Smoking status: Never Smoker  . Smokeless tobacco: Never Used  Substance Use Topics  . Alcohol use: No  . Drug use: Yes    Frequency: 3.0 times per week    Types: Marijuana     Medication list has been reviewed and updated.  Current Meds  Medication Sig  . busPIRone (BUSPAR) 5 MG tablet Take 1 tablet (5 mg total) by mouth 2 (two) times daily. (Patient taking differently: Take 5 mg by mouth as needed. )  . pantoprazole (PROTONIX) 40 MG tablet Take 1 tablet (40 mg total) by mouth daily. (Patient taking differently: Take 40 mg by mouth at bedtime. )    PHQ 2/9 Scores 12/30/2018 12/08/2018 02/20/2018 04/22/2017  PHQ - 2 Score 0 0 0 2  PHQ- 9 Score  1 9 1 4     BP Readings from Last 3 Encounters:  12/08/18 100/60  06/10/18 97/76  06/03/18 92/71    Physical Exam Vitals signs and nursing note reviewed.  Constitutional:      General: She is not in acute distress.    Appearance: She is not diaphoretic.  HENT:     Nose: Nose normal.  Eyes:     General:        Right eye: No discharge.        Left eye: No discharge.  Neck:     Musculoskeletal: Normal range of motion and neck supple.     Thyroid: No thyromegaly.     Vascular: No JVD.  Cardiovascular:     Rate and Rhythm: Normal rate.     Heart sounds: No murmur. No friction rub. No gallop.   Pulmonary:     Breath sounds: Normal breath sounds.  Abdominal:     General: Bowel sounds are normal.     Palpations: Abdomen is soft. There is no mass.     Tenderness: There is no abdominal tenderness. There is no guarding.  Lymphadenopathy:     Cervical: No cervical adenopathy.  Neurological:     Mental Status: She is alert.     Deep Tendon Reflexes: Reflexes are normal and symmetric.     Wt Readings from Last 3 Encounters:  12/30/18 94 lb (42.6 kg)  12/08/18 92 lb 9.6 oz (42 kg)  06/10/18 98 lb (44.5 kg)    Ht 5\' 1"  (1.549 m)   Wt 94 lb (42.6 kg)   LMP 12/26/2018   BMI 17.76 kg/m   Assessment and Plan:  I spent 10 minutes with this patient, More than 50% of that time was spent in voice to voice education, counseling and care coordination. 1. Anxiety New onset but controlled.  Patient is on BuSpar but she is taking it episodically which is okay at the time.  Will refill buspirone 5 mg to be taken up to twice a day as needed.  The gad score was 3 in the PHQ 9 is 1 which no further intervention is needed at this time.  Patient also notes some nausea with her first day of her menstrual cycle.  Patient was instructed to have food on board to take during this time to have some pain on her stomach at time of nausea.  And to continue her Protonix for the gastritis. - busPIRone  (BUSPAR) 5 MG tablet; Take 1 tablet (5 mg total) by mouth 2 (two) times daily.  Dispense: 60 tablet; Refill: 5

## 2019-01-23 ENCOUNTER — Other Ambulatory Visit: Payer: Self-pay | Admitting: Family Medicine

## 2019-01-23 DIAGNOSIS — F419 Anxiety disorder, unspecified: Secondary | ICD-10-CM

## 2019-02-09 ENCOUNTER — Other Ambulatory Visit: Payer: Self-pay

## 2019-02-09 DIAGNOSIS — R12 Heartburn: Secondary | ICD-10-CM

## 2019-02-25 ENCOUNTER — Other Ambulatory Visit: Admission: RE | Admit: 2019-02-25 | Payer: 59 | Source: Ambulatory Visit

## 2019-03-01 ENCOUNTER — Encounter: Admission: RE | Payer: Self-pay | Source: Home / Self Care

## 2019-03-01 ENCOUNTER — Other Ambulatory Visit: Payer: Self-pay | Admitting: Family Medicine

## 2019-03-01 ENCOUNTER — Ambulatory Visit: Admission: RE | Admit: 2019-03-01 | Payer: 59 | Source: Home / Self Care | Admitting: Gastroenterology

## 2019-03-01 DIAGNOSIS — K2900 Acute gastritis without bleeding: Secondary | ICD-10-CM

## 2019-03-01 SURGERY — ESOPHAGOGASTRODUODENOSCOPY (EGD) WITH PROPOFOL
Anesthesia: Choice

## 2019-03-30 ENCOUNTER — Ambulatory Visit: Payer: 59 | Admitting: Family Medicine

## 2019-04-05 ENCOUNTER — Ambulatory Visit: Payer: 59 | Admitting: Family Medicine

## 2019-04-13 ENCOUNTER — Other Ambulatory Visit: Payer: Self-pay

## 2019-04-13 ENCOUNTER — Telehealth: Payer: Self-pay | Admitting: Family Medicine

## 2019-04-13 ENCOUNTER — Ambulatory Visit
Admission: EM | Admit: 2019-04-13 | Discharge: 2019-04-13 | Disposition: A | Discharge status: Home / Self Care | Payer: 59 | Attending: Family Medicine | Admitting: Family Medicine

## 2019-04-13 ENCOUNTER — Telehealth: Payer: Self-pay | Admitting: Gastroenterology

## 2019-04-13 DIAGNOSIS — R1013 Epigastric pain: Secondary | ICD-10-CM | POA: Diagnosis not present

## 2019-04-13 DIAGNOSIS — K2900 Acute gastritis without bleeding: Secondary | ICD-10-CM

## 2019-04-13 DIAGNOSIS — R110 Nausea: Secondary | ICD-10-CM

## 2019-04-13 LAB — CBC WITH DIFFERENTIAL/PLATELET
Abs Immature Granulocytes: 0 10*3/uL (ref 0.00–0.07)
Basophils Absolute: 0 10*3/uL (ref 0.0–0.1)
Basophils Relative: 1 %
Eosinophils Absolute: 0 10*3/uL (ref 0.0–0.5)
Eosinophils Relative: 1 %
HCT: 39.1 % (ref 36.0–46.0)
Hemoglobin: 12.5 g/dL (ref 12.0–15.0)
Immature Granulocytes: 0 %
Lymphocytes Relative: 51 %
Lymphs Abs: 1.6 10*3/uL (ref 0.7–4.0)
MCH: 28.6 pg (ref 26.0–34.0)
MCHC: 32 g/dL (ref 30.0–36.0)
MCV: 89.5 fL (ref 80.0–100.0)
Monocytes Absolute: 0.2 10*3/uL (ref 0.1–1.0)
Monocytes Relative: 6 %
Neutro Abs: 1.3 10*3/uL — ABNORMAL LOW (ref 1.7–7.7)
Neutrophils Relative %: 41 %
Platelets: 224 10*3/uL (ref 150–400)
RBC: 4.37 MIL/uL (ref 3.87–5.11)
RDW: 12.2 % (ref 11.5–15.5)
WBC: 3 10*3/uL — ABNORMAL LOW (ref 4.0–10.5)
nRBC: 0 % (ref 0.0–0.2)

## 2019-04-13 LAB — COMPREHENSIVE METABOLIC PANEL
ALT: 12 U/L (ref 0–44)
AST: 20 U/L (ref 15–41)
Albumin: 4.6 g/dL (ref 3.5–5.0)
Alkaline Phosphatase: 47 U/L (ref 38–126)
Anion gap: 9 (ref 5–15)
BUN: 9 mg/dL (ref 6–20)
CO2: 22 mmol/L (ref 22–32)
Calcium: 9.5 mg/dL (ref 8.9–10.3)
Chloride: 105 mmol/L (ref 98–111)
Creatinine, Ser: 0.63 mg/dL (ref 0.44–1.00)
GFR calc Af Amer: >60 mL/min (ref >60)
GFR calc non Af Amer: >60 mL/min (ref >60)
Glucose, Bld: 108 mg/dL — ABNORMAL HIGH (ref 70–99)
Potassium: 3.4 mmol/L — ABNORMAL LOW (ref 3.5–5.1)
Sodium: 136 mmol/L (ref 135–145)
Total Bilirubin: 1.2 mg/dL (ref 0.3–1.2)
Total Protein: 8 g/dL (ref 6.5–8.1)

## 2019-04-13 LAB — TSH: TSH: 1.669 u[IU]/mL (ref 0.350–4.500)

## 2019-04-13 MED ORDER — ONDANSETRON 4 MG PO TBDP: 4.0000 mg | ORAL_TABLET | Freq: Three times a day (TID) | ORAL | 0 refills | Status: DC | PRN

## 2019-04-13 MED ORDER — PANTOPRAZOLE SODIUM 40 MG PO TBEC
40.0000 mg | DELAYED_RELEASE_TABLET | Freq: Every day | ORAL | 0 refills | Status: DC
Start: 2019-04-13 — End: 2023-05-12

## 2019-04-13 NOTE — Discharge Instructions (Signed)
Meds as prescribed.  Call Dr. Dorothey Baseman office to reschedule.  We will call with blood work results.  Take care  Dr. Lacinda Axon

## 2019-04-13 NOTE — ED Provider Notes (Signed)
[facility]    CSN: [id number] Arrival date & time: 04/13/19  1205   History   Chief Complaint Chief Complaint  Patient presents with  . Abdominal Pain   HPI  23 year old female presents with multiple complaints.  Patient reports that she has had ongoing issues with GERD and anxiety.  She has seen gastroenterology and is due for endoscopy.  Patient reports that over the past 3 days her symptoms have been worsening.  She reports a "lump" in her throat.  She reports upper abdominal pain.  Also endorses nausea and decrease in appetite.  Patient also reports tingling in her arms and legs.  She is no longer taking Protonix.  No fever.  No chills.  No known exacerbating factors.  No other complaints.  PMH, Surgical Hx, Family Hx, Social History reviewed and updated as below.  Past Medical History:  Diagnosis Date  . Allergy   . Breast mass   . Concussion    x 2  . Stye   Anxiety GERD  Past Surgical History:  Procedure Laterality Date  . NO PAST SURGERIES      OB History   No obstetric history on file.    Home Medications    Prior to Admission medications   Medication Sig Start Date End Date Taking? Authorizing Provider  ondansetron (ZOFRAN-ODT) 4 MG disintegrating tablet Take 1 tablet (4 mg total) by mouth every 8 (eight) hours as needed for nausea or vomiting. 04/13/19   Coral Spikes, DO  pantoprazole (PROTONIX) 40 MG tablet Take 1 tablet (40 mg total) by mouth daily. 04/13/19   Coral Spikes, DO    Family History Family History  Problem Relation Age of Onset  . Hypertension Mother   . Diabetes Paternal Grandmother   . Breast cancer Maternal Aunt   . Healthy Father     Social History Social History   Tobacco Use  . Smoking status: Never Smoker  . Smokeless tobacco: Never Used  Substance Use Topics  . Alcohol use: No  . Drug use: Not Currently     Allergies   Patient has no known allergies.   Review of Systems Review of Systems   Constitutional: Positive for appetite change.  Gastrointestinal: Positive for abdominal pain and nausea.  Neurological:       Paresthesia.   Physical Exam Triage Vital Signs ED Triage Vitals  Enc Vitals Group     BP 04/13/19 1242 108/73     Pulse Rate 04/13/19 1242 (!) 104     Resp 04/13/19 1242 18     Temp 04/13/19 1242 98.4 F (36.9 C)     Temp Source 04/13/19 1242 Oral     SpO2 04/13/19 1242 100 %     Weight 04/13/19 1241 90 lb (40.8 kg)     Height 04/13/19 1241 5' (1.524 m)     Head Circumference --      Peak Flow --      Pain Score 04/13/19 1241 6     Pain Loc --      Pain Edu? --      Excl. in [place]? --    Updated Vital Signs BP 108/73 (BP Location: Left Arm)   Pulse (!) 104   Temp 98.4 F (36.9 C) (Oral)   Resp 18   Ht 5' (1.524 m)   Wt 40.8 kg   LMP 04/05/2019   SpO2 100%   BMI 17.58 kg/m   Visual Acuity Right Eye Distance:  Left Eye Distance:   Bilateral Distance:    Right Eye Near:   Left Eye Near:    Bilateral Near:     Physical Exam Vitals signs and nursing note reviewed.  Constitutional:      General: She is not in acute distress.    Appearance: Normal appearance.  HENT:     Head: Normocephalic and atraumatic.  Eyes:     General:        Right eye: No discharge.        Left eye: No discharge.     Conjunctiva/sclera: Conjunctivae normal.  Cardiovascular:     Rate and Rhythm: Regular rhythm. Tachycardia present.  Pulmonary:     Effort: Pulmonary effort is normal.     Breath sounds: Normal breath sounds. No wheezing, rhonchi or rales.  Abdominal:     General: There is no distension.     Palpations: Abdomen is soft.     Comments: Mild epigastric tenderness to palpation.  Neurological:     Mental Status: She is alert.  Psychiatric:        Mood and Affect: Mood normal.        Behavior: Behavior normal.    UC Treatments / Results  Labs (all labs ordered are listed, but only abnormal results are displayed) Labs Reviewed  CBC WITH  DIFFERENTIAL/PLATELET - Abnormal; Notable for the following components:      Result Value   WBC 3.0 (*)    Neutro Abs 1.3 (*)    All other components within normal limits  COMPREHENSIVE METABOLIC PANEL - Abnormal; Notable for the following components:   Potassium 3.4 (*)    Glucose, Bld 108 (*)    All other components within normal limits  TSH    EKG   Radiology No results found.  Procedures Procedures (including critical care time)  Medications Ordered in UC Medications - No data to display  Initial Impression / Assessment and Plan / UC Course  I have reviewed the triage vital signs and the nursing notes.  Pertinent labs & imaging results that were available during my care of the patient were reviewed by me and considered in my medical decision making (see chart for details).    23 year old female presents with epigastric pain and associated nausea.  Zofran as prescribed.  Placing back on Protonix.  Needs to call GI for follow-up.  Laboratory studies obtained today at patient's request due to tingling.  Awaiting results.  Final Clinical Impressions(s) / UC Diagnoses   Final diagnoses:  Epigastric pain  Nausea     Discharge Instructions     Meds as prescribed.  Call Dr. Annabell SabalWohl's office to reschedule.  We will call with blood work results.  Take care  Dr. Adriana Simasook    ED Prescriptions    Medication Sig Dispense Auth. Provider   pantoprazole (PROTONIX) 40 MG tablet Take 1 tablet (40 mg total) by mouth daily. 90 tablet Melisse Caetano G, DO   ondansetron (ZOFRAN-ODT) 4 MG disintegrating tablet Take 1 tablet (4 mg total) by mouth every 8 (eight) hours as needed for nausea or vomiting. 20 tablet Tommie Samsook, Gizella [place] G, DO     Controlled Substance Prescriptions [place] Controlled Substance Registry consulted? Not Applicable   Tommie SamsCook, Nuha Degner G, DO 04/13/19 1502

## 2019-04-13 NOTE — Telephone Encounter (Signed)
What kind of blood work

## 2019-04-13 NOTE — Telephone Encounter (Signed)
Patient wants to do blood work.

## 2019-04-13 NOTE — Telephone Encounter (Signed)
Pt left vm to reschedule her Upper endoscopy

## 2019-04-13 NOTE — ED Triage Notes (Signed)
Patient complains of abdominal pain and fullness with burping, Tingling in arms and legs. Patient states that she has also had no appetite.

## 2019-04-21 NOTE — Telephone Encounter (Signed)
LVM for pt to return my call to reschedule EGD. 

## 2019-04-28 NOTE — Telephone Encounter (Signed)
MyChart message sent to pt to schedule EGD.

## 2019-04-30 ENCOUNTER — Other Ambulatory Visit: Payer: Self-pay

## 2019-04-30 DIAGNOSIS — R12 Heartburn: Secondary | ICD-10-CM

## 2019-05-14 ENCOUNTER — Other Ambulatory Visit
Admission: RE | Admit: 2019-05-14 | Discharge: 2019-05-14 | Disposition: A | Discharge status: Home / Self Care | Payer: 59 | Source: Ambulatory Visit | Attending: Gastroenterology | Admitting: Gastroenterology

## 2019-05-14 ENCOUNTER — Other Ambulatory Visit: Payer: Self-pay

## 2019-05-14 DIAGNOSIS — Z20828 Contact with and (suspected) exposure to other viral communicable diseases: Secondary | ICD-10-CM | POA: Insufficient documentation

## 2019-05-14 DIAGNOSIS — Z01812 Encounter for preprocedural laboratory examination: Secondary | ICD-10-CM | POA: Diagnosis present

## 2019-05-15 LAB — SARS CORONAVIRUS 2 (TAT 6-24 HRS): SARS Coronavirus 2: NEGATIVE

## 2019-05-18 ENCOUNTER — Ambulatory Visit
Admission: RE | Admit: 2019-05-18 | Discharge: 2019-05-18 | Disposition: A | Discharge status: Home / Self Care | Payer: 59 | Attending: Gastroenterology | Admitting: Gastroenterology

## 2019-05-18 ENCOUNTER — Other Ambulatory Visit: Payer: Self-pay

## 2019-05-18 ENCOUNTER — Encounter
Admission: RE | Disposition: A | Discharge status: Home / Self Care | Payer: Self-pay | Source: Home / Self Care | Attending: Gastroenterology

## 2019-05-18 ENCOUNTER — Ambulatory Visit: Payer: 59 | Admitting: Registered Nurse

## 2019-05-18 DIAGNOSIS — R1012 Left upper quadrant pain: Secondary | ICD-10-CM | POA: Insufficient documentation

## 2019-05-18 DIAGNOSIS — R634 Abnormal weight loss: Secondary | ICD-10-CM | POA: Diagnosis not present

## 2019-05-18 DIAGNOSIS — Z79899 Other long term (current) drug therapy: Secondary | ICD-10-CM | POA: Diagnosis not present

## 2019-05-18 DIAGNOSIS — R1013 Epigastric pain: Secondary | ICD-10-CM | POA: Diagnosis present

## 2019-05-18 DIAGNOSIS — R12 Heartburn: Secondary | ICD-10-CM

## 2019-05-18 HISTORY — PX: ESOPHAGOGASTRODUODENOSCOPY (EGD) WITH PROPOFOL: SHX5813

## 2019-05-18 LAB — POCT PREGNANCY, URINE: Preg Test, Ur: NEGATIVE

## 2019-05-18 SURGERY — ESOPHAGOGASTRODUODENOSCOPY (EGD) WITH PROPOFOL
Anesthesia: General

## 2019-05-18 MED ORDER — GLYCOPYRROLATE 0.2 MG/ML IJ SOLN
INTRAMUSCULAR | Status: DC | PRN
  Administered 2019-05-18: 0.2 mg via INTRAVENOUS

## 2019-05-18 MED ORDER — LIDOCAINE HCL (CARDIAC) PF 100 MG/5ML IV SOSY
PREFILLED_SYRINGE | INTRAVENOUS | Status: DC | PRN
  Administered 2019-05-18: 60 mg via INTRAVENOUS

## 2019-05-18 MED ORDER — SODIUM CHLORIDE 0.9 % IV SOLN
INTRAVENOUS | Status: DC
  Administered 2019-05-18: 10:00:00 via INTRAVENOUS

## 2019-05-18 MED ORDER — PROPOFOL 500 MG/50ML IV EMUL
INTRAVENOUS | Status: DC | PRN
  Administered 2019-05-18: 140 ug/kg/min via INTRAVENOUS

## 2019-05-18 MED ORDER — PROPOFOL 10 MG/ML IV BOLUS
INTRAVENOUS | Status: DC | PRN
  Administered 2019-05-18: 60 mg via INTRAVENOUS

## 2019-05-18 NOTE — H&P (Signed)
Selena Miniumarren Taiwan Millon, MD [facility] [address] [place], [place] [address] [phone] Fax : [phone]  Primary Care Physician:  Selena LimerickJones, Deanna C, MD Primary Gastroenterologist:  Dr. Servando Barton  Pre-Procedure History & Physical: HPI:  Selena DibbleKamilah Barton is a 23 y.o. female is here for an endoscopy.   Past Medical History:  Diagnosis Date  . Allergy   . Breast mass   . Concussion    x 2  . Stye     Past Surgical History:  Procedure Laterality Date  . NO PAST SURGERIES      Prior to Admission medications   Medication Sig Start Date End Date Taking? Authorizing Provider  ondansetron (ZOFRAN-ODT) 4 MG disintegrating tablet Take 1 tablet (4 mg total) by mouth every 8 (eight) hours as needed for nausea or vomiting. 04/13/19   Selena Samsook, Jayce G, DO  pantoprazole (PROTONIX) 40 MG tablet Take 1 tablet (40 mg total) by mouth daily. 04/13/19   Selena Samsook, Jayce G, DO    Allergies as of 04/30/2019  . (No Known Allergies)    Family History  Problem Relation Age of Onset  . Hypertension Mother   . Diabetes Paternal Grandmother   . Breast cancer Maternal Aunt   . Healthy Father     Social History   Socioeconomic History  . Marital status: Single    Spouse name: Not on file  . Number of children: Not on file  . Years of education: Not on file  . Highest education level: Not on file  Occupational History  . Not on file  Social Needs  . Financial resource strain: Not on file  . Food insecurity    Worry: Not on file    Inability: Not on file  . Transportation needs    Medical: Not on file    Non-medical: Not on file  Tobacco Use  . Smoking status: Never Smoker  . Smokeless tobacco: Never Used  Substance and Sexual Activity  . Alcohol use: No  . Drug use: Not Currently  . Sexual activity: Yes  Lifestyle  . Physical activity    Days per week: Not on file    Minutes per session: Not on file  . Stress: Not on file  Relationships  . Social Musicianconnections    Talks on phone: Not on  file    Gets together: Not on file    Attends religious service: Not on file    Active member of club or organization: Not on file    Attends meetings of clubs or organizations: Not on file    Relationship status: Not on file  . Intimate partner violence    Fear of current or ex partner: Not on file    Emotionally abused: Not on file    Physically abused: Not on file    Forced sexual activity: Not on file  Other Topics Concern  . Not on file  Social History Narrative  . Not on file    Review of Systems: See HPI, otherwise negative ROS  Physical Exam: BP 117/78   Pulse 70   Temp (!) 96.8 F (36 Barton) (Oral)   Resp 14   Ht 5' (1.524 m)   Wt 39 kg   LMP 04/23/2019   SpO2 100%   BMI 16.80 kg/m  General:   Alert,  pleasant and cooperative in NAD Head:  Normocephalic and atraumatic. Neck:  Supple; no masses or thyromegaly. Lungs:  Clear throughout to auscultation.    Heart:  Regular rate and  rhythm. Abdomen:  Soft, nontender and nondistended. Normal bowel sounds, without guarding, and without rebound.   Neurologic:  Alert and  oriented x4;  grossly normal neurologically.  Impression/Plan: Selena Barton is here for an endoscopy to be performed for N/V  Risks, benefits, limitations, and alternatives regarding  endoscopy have been reviewed with the patient.  Questions have been answered.  All parties agreeable.   Selena Lame, MD  05/18/2019, 10:02 AM

## 2019-05-18 NOTE — Anesthesia Post-op Follow-up Note (Signed)
Anesthesia QCDR form completed.        

## 2019-05-18 NOTE — Anesthesia Preprocedure Evaluation (Signed)
Anesthesia Evaluation  Patient identified by MRN, date of birth, ID band Patient awake    Reviewed: Allergy & Precautions, H&P , NPO status , Patient's Chart, lab work & pertinent test results, reviewed documented beta blocker date and time   Airway Mallampati: II   Neck ROM: full    Dental  (+) Poor Dentition   Pulmonary neg pulmonary ROS,    Pulmonary exam normal        Cardiovascular negative cardio ROS Normal cardiovascular exam Rhythm:regular Rate:Normal     Neuro/Psych  Headaches, PSYCHIATRIC DISORDERS Dementia    GI/Hepatic negative GI ROS, Neg liver ROS,   Endo/Other  negative endocrine ROS  Renal/GU negative Renal ROS  negative genitourinary   Musculoskeletal   Abdominal   Peds  Hematology negative hematology ROS (+)   Anesthesia Other Findings Past Medical History: No date: Allergy No date: Breast mass No date: Concussion     Comment:  x 2 No date: Stye Past Surgical History: No date: NO PAST SURGERIES BMI    Body Mass Index: 16.80 kg/m     Reproductive/Obstetrics negative OB ROS                             Anesthesia Physical Anesthesia Plan  ASA: II  Anesthesia Plan: General   Post-op Pain Management:    Induction:   PONV Risk Score and Plan:   Airway Management Planned:   Additional Equipment:   Intra-op Plan:   Post-operative Plan:   Informed Consent: I have reviewed the patients History and Physical, chart, labs and discussed the procedure including the risks, benefits and alternatives for the proposed anesthesia with the patient or authorized representative who has indicated his/her understanding and acceptance.     Dental Advisory Given  Plan Discussed with: CRNA  Anesthesia Plan Comments:         Anesthesia Quick Evaluation

## 2019-05-18 NOTE — Transfer of Care (Signed)
Immediate Anesthesia Transfer of Care Note  Patient: Selena Barton  Procedure(s) Performed: ESOPHAGOGASTRODUODENOSCOPY (EGD) WITH PROPOFOL (N/A )  Patient Location: PACU  Anesthesia Type:General  Level of Consciousness: sedated  Airway & Oxygen Therapy: Patient Spontanous Breathing  Post-op Assessment: Report given to RN and Post -op Vital signs reviewed and stable  Post vital signs: Reviewed and stable  Last Vitals:  Vitals Value Taken Time  BP 105/63 05/18/19 1014  Temp 36.4 C 05/18/19 1014  Pulse 105 05/18/19 1014  Resp 17 05/18/19 1014  SpO2 98 % 05/18/19 1014    Last Pain:  Vitals:   05/18/19 1014  TempSrc:   PainSc: 0-No pain         Complications: No apparent anesthesia complications

## 2019-05-18 NOTE — Op Note (Signed)
[facility] Gastroenterology Patient Name: Selena DibbleKamilah Castner Procedure Date: 05/18/2019 9:53 AM MRN: [id number] Account #: [id number] Date of Birth: 11/10/1995 Admit Type: Outpatient Age: [age] [facility] Gender: Female Note Status: Finalized Procedure:            Upper GI endoscopy Indications:          Abdominal pain in the left upper quadrant, Dyspepsia,                        Weight loss Providers:            Midge Miniumarren Braelynne Garinger MD, MD Medicines:            Propofol per Anesthesia Complications:        No immediate complications. Procedure:            Pre-Anesthesia Assessment:                       - Prior to the procedure, a History and Physical was                        performed, and patient medications and allergies were                        reviewed. The patient's tolerance of previous                        anesthesia was also reviewed. The risks and benefits of                        the procedure and the sedation options and risks were                        discussed with the patient. All questions were                        answered, and informed consent was obtained. Prior                        Anticoagulants: The patient has taken no previous                        anticoagulant or antiplatelet agents. ASA Grade                        Assessment: II - A patient with mild systemic disease.                        After reviewing the risks and benefits, the patient was                        deemed in satisfactory condition to undergo the                        procedure.                       After obtaining informed consent, the endoscope was                        passed under direct vision.  Throughout the procedure,                        the patient's blood pressure, pulse, and oxygen                        saturations were monitored continuously. The Endoscope                        was introduced through the mouth, and advanced to the                    second part of duodenum. The upper GI endoscopy was                        accomplished without difficulty. The patient tolerated                        the procedure well. Findings:      The examined esophagus was normal.      The entire examined stomach was normal. Biopsies were taken with a cold       forceps for Helicobacter pylori testing.      The examined duodenum was normal. Biopsies for histology were taken with       a cold forceps for evaluation of celiac disease. Impression:           - Normal esophagus.                       - Normal stomach. Biopsied.                       - Normal examined duodenum. Biopsied. Recommendation:       - Discharge patient to home.                       - Resume previous diet.                       - Continue present medications.                       - Await pathology results. Procedure Code(s):    --- Professional ---                       [phone], Esophagogastroduodenoscopy, flexible, transoral;                        with biopsy, single or multiple Diagnosis Code(s):    --- Professional ---                       R10.12, Left upper quadrant pain                       R10.13, Epigastric pain                       R63.4, Abnormal weight loss CPT copyright 2019 American Medical Association. All rights reserved. The codes documented in this report are preliminary and upon coder review may  be revised to meet current compliance requirements. Lucilla Lame MD, MD 05/18/2019 10:11:32 AM This report has been signed electronically. Number of Addenda: 0 Note Initiated  On: 05/18/2019 9:53 AM Estimated Blood Loss: Estimated blood loss: none.      [facility]

## 2019-05-18 NOTE — Anesthesia Postprocedure Evaluation (Signed)
Anesthesia Post Note  Patient: Selena Barton  Procedure(s) Performed: ESOPHAGOGASTRODUODENOSCOPY (EGD) WITH PROPOFOL (N/A )  Patient location during evaluation: PACU Anesthesia Type: General Level of consciousness: awake and alert Pain management: pain level controlled Vital Signs Assessment: post-procedure vital signs reviewed and stable Respiratory status: spontaneous breathing, nonlabored ventilation, respiratory function stable and patient connected to nasal cannula oxygen Cardiovascular status: blood pressure returned to baseline and stable Postop Assessment: no apparent nausea or vomiting Anesthetic complications: no     Last Vitals:  Vitals:   05/18/19 0930 05/18/19 1014  BP: 117/78 105/63  Pulse: 70 (!) 105  Resp: 14 17  Temp: (!) 36 C 36.4 C  SpO2: 100% 98%    Last Pain:  Vitals:   05/18/19 1034  TempSrc:   PainSc: 0-No pain                 Molli Barrows

## 2019-05-19 ENCOUNTER — Encounter: Payer: Self-pay | Admitting: Gastroenterology

## 2019-05-19 LAB — SURGICAL PATHOLOGY

## 2019-05-20 ENCOUNTER — Encounter: Payer: Self-pay | Admitting: Gastroenterology

## 2019-07-05 ENCOUNTER — Other Ambulatory Visit: Payer: Self-pay | Admitting: Family Medicine

## 2019-07-05 DIAGNOSIS — K2900 Acute gastritis without bleeding: Secondary | ICD-10-CM

## 2020-08-17 ENCOUNTER — Other Ambulatory Visit: Payer: Self-pay | Admitting: Family Medicine

## 2020-08-17 DIAGNOSIS — K2900 Acute gastritis without bleeding: Secondary | ICD-10-CM

## 2022-01-27 ENCOUNTER — Ambulatory Visit (HOSPITAL_COMMUNITY)
Admission: EM | Admit: 2022-01-27 | Discharge: 2022-01-27 | Disposition: A | Discharge status: Home / Self Care | Payer: 59 | Attending: Emergency Medicine | Admitting: Emergency Medicine

## 2022-01-27 DIAGNOSIS — R21 Rash and other nonspecific skin eruption: Secondary | ICD-10-CM | POA: Diagnosis not present

## 2022-01-27 DIAGNOSIS — K0889 Other specified disorders of teeth and supporting structures: Secondary | ICD-10-CM | POA: Diagnosis not present

## 2022-01-27 MED ORDER — ONDANSETRON HCL 4 MG PO TABS: 4.0000 mg | ORAL_TABLET | Freq: Three times a day (TID) | ORAL | 0 refills | Status: DC | PRN

## 2022-01-27 MED ORDER — PREDNISONE 10 MG PO TABS: 20.0000 mg | ORAL_TABLET | Freq: Every day | ORAL | 0 refills | Status: DC

## 2022-01-27 NOTE — ED Triage Notes (Signed)
Patient presents to Urgent Care with complaints of rash since Thursday. Patient reports she has her wisdom teeth coming in and also having pain she has been taking tylenol for.

## 2022-01-27 NOTE — ED Provider Notes (Signed)
Selena Barton - URGENT [facility]ARE C[facility]   MRN: [id number] DOB: 1996/04/05  Subjective:   Chief Complaint;  Chief Complaint  Patient presents with   Rash    Entire facial rash, heat flashes, swollen lymph nodes in neck. - Entered by patient   Patient presents to Urgent Care with complaints of rash since Thursday. Patient reports she has her wisdom teeth coming in and also having pain she has been taking tylenol  Selena Barton is a 26 y.o. female presenting for facial rash for the last 4 days she believes is related to the intense pain she is having from her wisdom teeth coming in.  She denies fever but gets hot and cold chills like hot flashes.  She denies new facial products or contacts and no antibiotics.  Takes Zyrtec daily and sometimes gets nauseated  No current facility-administered medications for this encounter.  Current Outpatient Medications:    ondansetron (ZOFRAN) 4 MG tablet, Take 1 tablet (4 mg total) by mouth every 8 (eight) hours as needed for nausea or vomiting., Disp: 4 tablet, Rfl: 0   predniSONE (DELTASONE) 10 MG tablet, Take 2 tablets (20 mg total) by mouth daily., Disp: 15 tablet, Rfl: 0   pantoprazole (PROTONIX) 40 MG tablet, Take 1 tablet (40 mg total) by mouth daily. (Patient not taking: Reported on 01/27/2022), Disp: 90 tablet, Rfl: 0   No Known Allergies  Past Medical History:  Diagnosis Date   Allergy    Breast mass    Concussion    x 2   Stye      Review of Systems  All other systems reviewed and are negative.   Objective:   Vitals: BP (!) 118/59   Pulse 76   Temp 98.7 F (37.1 C) (Oral)   Resp 18   LMP 01/25/2022   SpO2 98%   Physical Exam Vitals and nursing note reviewed.  Constitutional:      General: She is not in acute distress.    Appearance: She is well-developed.  HENT:     Head: Normocephalic and atraumatic.     Right Ear: Tympanic membrane normal.     Left Ear: Tympanic membrane normal.     Nose: Nose normal.     Mouth/Throat:      Comments: Lower wisdom teeth coming in no obvious signs of infection Eyes:     Conjunctiva/sclera: Conjunctivae normal.  Cardiovascular:     Rate and Rhythm: Normal rate and regular rhythm.     Heart sounds: No murmur heard. Pulmonary:     Effort: Pulmonary effort is normal. No respiratory distress.     Breath sounds: Normal breath sounds.  Musculoskeletal:        General: No swelling.     Cervical back: Neck supple.  Skin:    General: Skin is warm and dry.     Capillary Refill: Capillary refill takes less than 2 seconds.     Comments: Very light discrete lesion 1 mm flesh-colored rash no secondary signs of infection or infestation covering entire face  Neurological:     Mental Status: She is alert.  Psychiatric:        Mood and Affect: Mood normal.    No results found for this or any previous visit (from the past 24 hour(s)).  No results found.     Assessment and Plan :   1. Rash   2. Pain, dental     Meds ordered this encounter  Medications   predniSONE (DELTASONE) 10 MG tablet  Sig: Take 2 tablets (20 mg total) by mouth daily.    Dispense:  15 tablet    Refill:  0   ondansetron (ZOFRAN) 4 MG tablet    Sig: Take 1 tablet (4 mg total) by mouth every 8 (eight) hours as needed for nausea or vomiting.    Dispense:  4 tablet    Refill:  0    MDM:  Selena Barton is a 26 y.o. female presenting for facial swelling and rash while wisdom teeth are coming in.  Her physical exam shows a discrete lesion fine rash diffuse to the face no gross swelling or evidence of facial cellulitis.  I believe this could be caused from stress reaction due to the pain of her wisdom teeth coming in.  I prescribed steroid histamine blocker and antinausea as needed.  She is encouraged to drink plenty of fluids and add Benadryl and Anbesol as needed.  She will return if worse or new symptoms.  I discussed treatment, follow up and return instructions. Questions were answered. Patient/representative  stated understanding of instructions and patient is stable for discharge.  Dewaine Conger FNP-C MCN    Jone Baseman, NP 01/27/22 1800

## 2022-01-27 NOTE — Discharge Instructions (Addendum)
The  rash on your face could be due to stress or allergic in nature.  Take steroid as directed and continue Zyrtec.  Add ambesol or topical numbing agent all around your wisdom teeth.  Continue Tylenol for pain.  Add Benadryl at night if itching.  Drink plenty of water add Pepcid AC 20 mg a day for stomach protection and add additional antihistamine.  Return to the ER or urgent care if worse or new symptoms for reevaluation

## 2023-05-12 ENCOUNTER — Ambulatory Visit (INDEPENDENT_AMBULATORY_CARE_PROVIDER_SITE_OTHER): Payer: 59 | Admitting: Podiatry

## 2023-05-12 ENCOUNTER — Encounter: Payer: Self-pay | Admitting: Podiatry

## 2023-05-12 DIAGNOSIS — L600 Ingrowing nail: Secondary | ICD-10-CM

## 2023-05-12 MED ORDER — NEOMYCIN-POLYMYXIN-HC 1 % OT SOLN: OTIC | 1 refills | Status: DC

## 2023-05-12 NOTE — Progress Notes (Signed)
  Subjective:  Patient ID: Selena Barton, female    DOB: 03/06/1996,  MRN: [id number] HPI Chief Complaint  Patient presents with   Nail Problem    Hallux left - starting to develop layers in the toenail x 8 years, gets swollen at the base of the nail at times   New Patient (Initial Visit)    27 y.o. female presents with the above complaint.   ROS: Denies fever chills nausea vomit muscle aches pains calf pain back pain chest pain shortness of breath.  Past Medical History:  Diagnosis Date   Allergy    Breast mass    Concussion    x 2   Stye    Past Surgical History:  Procedure Laterality Date   ESOPHAGOGASTRODUODENOSCOPY (EGD) WITH PROPOFOL N/A 05/18/2019   Procedure: ESOPHAGOGASTRODUODENOSCOPY (EGD) WITH PROPOFOL;  Surgeon: Midge Minium, MD;  Location: [facility] ENDOSCOPY;  Service: Endoscopy;  Laterality: N/A;   NO PAST SURGERIES      Current Outpatient Medications:    NEOMYCIN-POLYMYXIN-HYDROCORTISONE (CORTISPORIN) 1 % SOLN OTIC solution, Apply 1-2 drops to toe BID after soaking, Disp: 10 mL, Rfl: 1  No Known Allergies Review of Systems Objective:  There were no vitals filed for this visit.  General: Well developed, nourished, in no acute distress, alert and oriented x3   Dermatological: Skin is warm, dry and supple bilateral. Nails x 10 are well maintained; remaining integument appears unremarkable at this time. There are no open sores, no preulcerative lesions, no rash or signs of infection present.  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  Vascular: Dorsalis Pedis artery and Posterior Tibial artery pedal pulses are 2/4 bilateral with immedate capillary fill time. Pedal hair growth present. No varicosities and no lower extremity edema present bilateral.   Neruologic: Grossly intact via light touch bilateral. Vibratory intact via tuning fork bilateral. Protective threshold with Semmes Wienstein monofilament intact to all pedal sites bilateral. Patellar  and Achilles deep tendon reflexes 2+ bilateral. No Babinski or clonus noted bilateral.   Musculoskeletal: No gross boney pedal deformities bilateral. No pain, crepitus, or limitation noted with foot and ankle range of motion bilateral. Muscular strength 5/5 in all groups tested bilateral.  Gait: Unassisted, Nonantalgic.    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  She tolerated procedure well after local anesthetic was administered was given both oral and written home-going instruction for the care and soaking of the toe.  We did discuss possible complications which may include worsening thickening of the toenail and distortion of the nail.     Bailley Guilford T. [place], [place]

## 2023-05-12 NOTE — Patient Instructions (Signed)

## 2023-05-26 ENCOUNTER — Encounter: Payer: Self-pay | Admitting: Podiatry

## 2023-05-26 ENCOUNTER — Ambulatory Visit (INDEPENDENT_AMBULATORY_CARE_PROVIDER_SITE_OTHER): Payer: 59 | Admitting: Podiatry

## 2023-05-26 DIAGNOSIS — L600 Ingrowing nail: Secondary | ICD-10-CM

## 2023-05-26 DIAGNOSIS — Z9889 Other specified postprocedural states: Secondary | ICD-10-CM

## 2023-05-26 NOTE — Progress Notes (Signed)
She presents today for follow-up of her matrixectomy hallux left tibia and fibular border.  She states that is likely and a little bit sore.  Objective: Also stable alert and oriented x 3 there is no erythema edema salines drainage odor appears to be healing very well.  Assessment: Well-healing surgical toe.  Plan: Encouraged her to soak Epsom salts and warm water every other day until completely healed follow-up with her on an as-needed basis.

## 2023-06-16 ENCOUNTER — Emergency Department
Admission: EM | Admit: 2023-06-16 | Discharge: 2023-06-16 | Disposition: A | Discharge status: Home / Self Care | Payer: 59 | Attending: Emergency Medicine | Admitting: Emergency Medicine

## 2023-06-16 ENCOUNTER — Other Ambulatory Visit: Payer: Self-pay

## 2023-06-16 ENCOUNTER — Emergency Department: Payer: 59

## 2023-06-16 DIAGNOSIS — X501XXA Overexertion from prolonged static or awkward postures, initial encounter: Secondary | ICD-10-CM | POA: Insufficient documentation

## 2023-06-16 DIAGNOSIS — M25551 Pain in right hip: Secondary | ICD-10-CM | POA: Diagnosis present

## 2023-06-16 LAB — POC URINE PREG, ED: Preg Test, Ur: NEGATIVE

## 2023-06-16 MED ORDER — IBUPROFEN 800 MG PO TABS
800.0000 mg | ORAL_TABLET | Freq: Once | ORAL | Status: AC
  Administered 2023-06-16: 800 mg via ORAL
  Filled 2023-06-16: qty 1

## 2023-06-16 MED ORDER — IBUPROFEN 800 MG PO TABS: 800.0000 mg | ORAL_TABLET | Freq: Three times a day (TID) | ORAL | 0 refills | Status: AC | PRN

## 2023-06-16 NOTE — Discharge Instructions (Addendum)
Your evaluated in the ED for right hip pain.  Your x-ray is normal.  Weight-bear as tolerated with the assistance of crutches.  Take ibuprofen for pain as needed.  Alternate ice and warm application over the area to help with pain.  Follow-up with orthopedic.

## 2023-06-16 NOTE — ED Provider Notes (Signed)
[facility] Emergency Department Provider Note     Event Date/Time   First MD Initiated Contact with Patient 06/16/23 1338     (approximate)   History   Hip Pain   HPI  Selena Barton is a 27 y.o. female presents to the ED with complaint of right hip pain.  Patient reports she was sitting on the toilet and felt a pop. Patient denies falling.  Patient is unable to bear weight on the affected leg.  She reports history of frequent hip popping but she has never experienced pain like this before.  Denies numbness and tingling.    Physical Exam   Triage Vital Signs: ED Triage Vitals  Encounter Vitals Group     BP 06/16/23 1257 117/86     Systolic BP Percentile --      Diastolic BP Percentile --      Pulse Rate 06/16/23 1257 85     Resp 06/16/23 1257 18     Temp 06/16/23 1257 98 F (36.7 C)     Temp src --      SpO2 06/16/23 1257 100 %     Weight 06/16/23 1258 98 lb (44.5 kg)     Height 06/16/23 1258 5\' 1"  (1.549 m)     Head Circumference --      Peak Flow --      Pain Score 06/16/23 1257 9     Pain Loc --      Pain Education --      Exclude from Growth Chart --     Most recent vital signs: Vitals:   06/16/23 1257  BP: 117/86  Pulse: 85  Resp: 18  Temp: 98 F (36.7 C)  SpO2: 100%    General: Well appearing. Alert and oriented. INAD.  Skin:  Warm, dry and intact. No rashes or lesions noted.     Head:  NCAT.  Eyes:  PERRLA. EOMI.  CV:  Good peripheral perfusion. RRR. RESP:  Normal effort. LCTAB.   BACK:  Spinous process is midline without deformity or tenderness. MSK:   Right hip reveals no deformity. Non tender to palpation. Patient is ambulatory. Full ROM in all joints. No swelling, deformity or tenderness.   ED Results / Procedures / Treatments   Labs (all labs ordered are listed, but only abnormal results are displayed) Labs Reviewed  POC URINE PREG, ED   RADIOLOGY  I personally viewed and evaluated these images as part of  my medical decision making, as well as reviewing the written report by the radiologist.  ED Provider Interpretation: Right hip appears normal  DG Hip Unilat  With Pelvis 2-3 Views Right  Result Date: 06/16/2023 CLINICAL DATA:  Right hip pain, popping sensation EXAM: DG HIP (WITH OR WITHOUT PELVIS) 2-3V RIGHT COMPARISON:  None Available. FINDINGS: There is no evidence of hip fracture or dislocation. There is no evidence of arthropathy or other focal bone abnormality. IMPRESSION: Negative. Electronically Signed   By: Judie Petit.  Shick M.D.   On: 06/16/2023 16:36    PROCEDURES:  Critical Care performed: No  Procedures  MEDICATIONS ORDERED IN ED: Medications  ibuprofen (ADVIL) tablet 800 mg (800 mg Oral Given 06/16/23 1439)    IMPRESSION / MDM / ASSESSMENT AND PLAN / ED COURSE  I reviewed the triage vital signs and the nursing notes.  27 y.o. female presents to the emergency department for evaluation and treatment of acute right hip pain without injury. See HPI for further details.   Differential diagnosis includes, but is not limited to dislocation, fracture, strain, tendinitis.  Patient's presentation is most consistent with acute complicated illness / injury requiring diagnostic workup.  Patient is alert and oriented.  She is hemodynamically stable.  Physical exam findings are benign and reassuring.  Hip x-ray is reassuring.  Patient is able to ambulate without difficulty.  She will be provided with crutches for assistance.  She is in stable condition for discharge home.  She is encouraged to follow-up with orthopedic due to frequent popping of hips.  ED precautions discussed.   FINAL CLINICAL IMPRESSION(S) / ED DIAGNOSES   Final diagnoses:  Right hip pain   Rx / DC Orders   ED Discharge Orders          Ordered    ibuprofen (ADVIL) 800 MG tablet  Every 8 hours PRN        06/16/23 1650            Note:  This document was prepared using Dragon  voice recognition software and may include unintentional dictation errors.    Romeo Apple, Bralyn Espino A, PA-C 06/16/23 1656    Phineas Semen, MD 06/17/23 [phone]

## 2023-06-16 NOTE — ED Notes (Signed)
Crutches given and taught pt wheeled out to POV with friend

## 2023-06-16 NOTE — ED Triage Notes (Signed)
Pt comes with c/o right hip pain. Pt states she went to sit down on toilet and heard something pop. Pt states since she can't walk on it and bear weight.

## 2023-07-29 ENCOUNTER — Other Ambulatory Visit: Payer: Self-pay | Admitting: Orthopedic Surgery

## 2023-07-29 DIAGNOSIS — S76011A Strain of muscle, fascia and tendon of right hip, initial encounter: Secondary | ICD-10-CM

## 2023-07-29 DIAGNOSIS — M25551 Pain in right hip: Secondary | ICD-10-CM

## 2023-08-06 ENCOUNTER — Ambulatory Visit
Admission: RE | Admit: 2023-08-06 | Discharge: 2023-08-06 | Disposition: A | Discharge status: Home / Self Care | Payer: 59 | Source: Ambulatory Visit | Attending: Orthopedic Surgery

## 2023-08-06 ENCOUNTER — Ambulatory Visit
Admission: RE | Admit: 2023-08-06 | Discharge: 2023-08-06 | Disposition: A | Discharge status: Home / Self Care | Payer: 59 | Source: Ambulatory Visit | Attending: Orthopedic Surgery | Admitting: Orthopedic Surgery

## 2023-08-06 DIAGNOSIS — S76011A Strain of muscle, fascia and tendon of right hip, initial encounter: Secondary | ICD-10-CM | POA: Insufficient documentation

## 2023-08-06 DIAGNOSIS — M25551 Pain in right hip: Secondary | ICD-10-CM | POA: Diagnosis present

## 2023-08-06 DIAGNOSIS — X58XXXA Exposure to other specified factors, initial encounter: Secondary | ICD-10-CM | POA: Diagnosis not present

## 2023-08-06 MED ORDER — SODIUM CHLORIDE (PF) 0.9% IJ SOLUTION - NO CHARGE
5.0000 mL | INTRAMUSCULAR | Status: DC | PRN
  Administered 2023-08-06: 5 mL
  Filled 2023-08-06: qty 6

## 2023-08-06 MED ORDER — LIDOCAINE 1 % OPTIME INJ - NO CHARGE
5.0000 mL | Freq: Once | INTRAMUSCULAR | Status: AC
  Administered 2023-08-06: 5 mL via SUBCUTANEOUS
  Filled 2023-08-06: qty 6

## 2023-08-06 MED ORDER — IOHEXOL 180 MG/ML  SOLN
15.0000 mL | Freq: Once | INTRAMUSCULAR | Status: AC | PRN
  Administered 2023-08-06: 15 mL via INTRATHECAL

## 2023-08-19 ENCOUNTER — Encounter: Payer: Self-pay | Admitting: Podiatry

## 2023-08-20 ENCOUNTER — Encounter: Payer: Self-pay | Admitting: Podiatry

## 2023-08-20 ENCOUNTER — Ambulatory Visit (INDEPENDENT_AMBULATORY_CARE_PROVIDER_SITE_OTHER): Payer: 59 | Admitting: Podiatry

## 2023-08-20 DIAGNOSIS — L03032 Cellulitis of left toe: Secondary | ICD-10-CM

## 2023-08-20 NOTE — Patient Instructions (Signed)

## 2023-08-20 NOTE — Progress Notes (Signed)
She presents today for follow-up hallux left.  We did a matrixectomy to the tibial-fibular border back in September and she is concerned that is dark and that is bleeding some.  Objective: Vital signs stable alert oriented x 3.  It does look like she has a proximal paronychia.  Assessment: Proximal paronychia hallux left.  Plan: Total nail avulsion was performed today after local anesthetic was administered tolerated procedure well.  She was given both oral and home-going structure for care and soaking of the toe as well as Cortisporin otic drops.  She will follow-up with me in 2 to 3 weeks make sure that it is healing.

## 2023-09-15 ENCOUNTER — Ambulatory Visit: Payer: 59 | Admitting: Podiatry
